# Patient Record
Sex: Male | Born: 1965 | Race: Black or African American | Hispanic: No | Marital: Married | Smoking: Never smoker
Health system: Southern US, Community
[De-identification: ages and names within clinical notes are randomized; demographics above are authoritative.]

## PROBLEM LIST (undated history)

## (undated) DIAGNOSIS — F419 Anxiety disorder, unspecified: Secondary | ICD-10-CM

## (undated) DIAGNOSIS — M1990 Unspecified osteoarthritis, unspecified site: Secondary | ICD-10-CM

## (undated) DIAGNOSIS — K219 Gastro-esophageal reflux disease without esophagitis: Secondary | ICD-10-CM

## (undated) HISTORY — DX: Unspecified osteoarthritis, unspecified site: M19.90

## (undated) HISTORY — DX: Anxiety disorder, unspecified: F41.9

## (undated) HISTORY — DX: Gastro-esophageal reflux disease without esophagitis: K21.9

---

## 1997-11-28 ENCOUNTER — Emergency Department (HOSPITAL_COMMUNITY): Admission: EM | Admit: 1997-11-28 | Discharge: 1997-11-28 | Payer: Self-pay | Admitting: Emergency Medicine

## 1997-12-08 ENCOUNTER — Emergency Department (HOSPITAL_COMMUNITY): Admission: EM | Admit: 1997-12-08 | Discharge: 1997-12-08 | Payer: Self-pay | Admitting: Emergency Medicine

## 1997-12-12 ENCOUNTER — Emergency Department (HOSPITAL_COMMUNITY): Admission: EM | Admit: 1997-12-12 | Discharge: 1997-12-12 | Payer: Self-pay | Admitting: Emergency Medicine

## 1998-02-27 ENCOUNTER — Emergency Department (HOSPITAL_COMMUNITY): Admission: EM | Admit: 1998-02-27 | Discharge: 1998-02-27 | Payer: Self-pay | Admitting: Emergency Medicine

## 1998-11-08 ENCOUNTER — Emergency Department (HOSPITAL_COMMUNITY): Admission: EM | Admit: 1998-11-08 | Discharge: 1998-11-08 | Payer: Self-pay | Admitting: Emergency Medicine

## 2000-05-03 ENCOUNTER — Ambulatory Visit (HOSPITAL_BASED_OUTPATIENT_CLINIC_OR_DEPARTMENT_OTHER): Admission: RE | Admit: 2000-05-03 | Discharge: 2000-05-03 | Payer: Self-pay | Admitting: *Deleted

## 2000-05-29 ENCOUNTER — Other Ambulatory Visit: Admission: RE | Admit: 2000-05-29 | Discharge: 2000-05-29 | Payer: Self-pay | Admitting: *Deleted

## 2000-05-29 ENCOUNTER — Encounter (INDEPENDENT_AMBULATORY_CARE_PROVIDER_SITE_OTHER): Payer: Self-pay

## 2002-06-01 ENCOUNTER — Emergency Department (HOSPITAL_COMMUNITY): Admission: EM | Admit: 2002-06-01 | Discharge: 2002-06-01 | Payer: Self-pay | Admitting: Emergency Medicine

## 2003-06-16 ENCOUNTER — Emergency Department (HOSPITAL_COMMUNITY): Admission: EM | Admit: 2003-06-16 | Discharge: 2003-06-16 | Payer: Self-pay | Admitting: Emergency Medicine

## 2003-07-12 ENCOUNTER — Emergency Department (HOSPITAL_COMMUNITY): Admission: EM | Admit: 2003-07-12 | Discharge: 2003-07-12 | Payer: Self-pay | Admitting: Emergency Medicine

## 2003-08-18 ENCOUNTER — Emergency Department (HOSPITAL_COMMUNITY): Admission: EM | Admit: 2003-08-18 | Discharge: 2003-08-18 | Payer: Self-pay

## 2005-08-20 ENCOUNTER — Emergency Department (HOSPITAL_COMMUNITY): Admission: EM | Admit: 2005-08-20 | Discharge: 2005-08-20 | Payer: Self-pay | Admitting: Emergency Medicine

## 2005-10-09 ENCOUNTER — Emergency Department (HOSPITAL_COMMUNITY): Admission: EM | Admit: 2005-10-09 | Discharge: 2005-10-09 | Payer: Self-pay | Admitting: Emergency Medicine

## 2005-10-11 ENCOUNTER — Emergency Department (HOSPITAL_COMMUNITY): Admission: EM | Admit: 2005-10-11 | Discharge: 2005-10-11 | Payer: Self-pay | Admitting: Emergency Medicine

## 2008-04-03 ENCOUNTER — Emergency Department (HOSPITAL_COMMUNITY): Admission: EM | Admit: 2008-04-03 | Discharge: 2008-04-03 | Payer: Self-pay | Admitting: Emergency Medicine

## 2008-12-23 ENCOUNTER — Emergency Department (HOSPITAL_COMMUNITY): Admission: EM | Admit: 2008-12-23 | Discharge: 2008-12-23 | Payer: Self-pay | Admitting: Family Medicine

## 2009-01-01 ENCOUNTER — Emergency Department (HOSPITAL_COMMUNITY): Admission: EM | Admit: 2009-01-01 | Discharge: 2009-01-01 | Payer: Self-pay | Admitting: Emergency Medicine

## 2009-06-29 ENCOUNTER — Emergency Department (HOSPITAL_COMMUNITY)
Admission: EM | Admit: 2009-06-29 | Discharge: 2009-06-30 | Payer: Self-pay | Source: Home / Self Care | Admitting: Emergency Medicine

## 2009-11-04 ENCOUNTER — Emergency Department (HOSPITAL_COMMUNITY): Admission: EM | Admit: 2009-11-04 | Discharge: 2009-11-04 | Payer: Self-pay | Admitting: Emergency Medicine

## 2010-07-11 LAB — BASIC METABOLIC PANEL
BUN: 11 mg/dL (ref 6–23)
Calcium: 9.7 mg/dL (ref 8.4–10.5)
Creatinine, Ser: 0.97 mg/dL (ref 0.4–1.5)
GFR calc Af Amer: >60 mL/min (ref >60)
GFR calc non Af Amer: >60 mL/min (ref >60)
Glucose, Bld: 91 mg/dL (ref 70–99)

## 2010-07-11 LAB — DIFFERENTIAL
Lymphocytes Relative: 29 % (ref 12–46)
Lymphs Abs: 2.5 10*3/uL (ref 0.7–4.0)
Monocytes Absolute: 1 10*3/uL (ref 0.1–1.0)
Monocytes Relative: 11 % (ref 3–12)
Neutro Abs: 4.9 10*3/uL (ref 1.7–7.7)
Neutrophils Relative %: 56 % (ref 43–77)

## 2010-07-11 LAB — CBC
MCV: 93.7 fL (ref 78.0–100.0)
Platelets: 256 10*3/uL (ref 150–400)
RDW: 12.5 % (ref 11.5–15.5)
WBC: 8.8 10*3/uL (ref 4.0–10.5)

## 2010-07-11 LAB — PROTIME-INR
INR: 1 (ref 0.00–1.49)
Prothrombin Time: 13.1 seconds (ref 11.6–15.2)

## 2010-07-11 LAB — POCT CARDIAC MARKERS
CKMB, poc: <1 ng/mL — ABNORMAL LOW (ref 1.0–8.0)
Myoglobin, poc: 99.4 ng/mL (ref 12–200)

## 2010-07-18 LAB — CBC
HCT: 37.6 % — ABNORMAL LOW (ref 39.0–52.0)
Hemoglobin: 13 g/dL (ref 13.0–17.0)
WBC: 12.3 10*3/uL — ABNORMAL HIGH (ref 4.0–10.5)

## 2010-07-18 LAB — POCT CARDIAC MARKERS

## 2010-07-18 LAB — POCT I-STAT, CHEM 8
BUN: 13 mg/dL (ref 6–23)
Calcium, Ion: 1.16 mmol/L (ref 1.12–1.32)
Chloride: 105 mEq/L (ref 96–112)
Glucose, Bld: 91 mg/dL (ref 70–99)
HCT: 41 % (ref 39.0–52.0)
Hemoglobin: 13.9 g/dL (ref 13.0–17.0)

## 2010-07-18 LAB — DIFFERENTIAL
Basophils Absolute: 0 10*3/uL (ref 0.0–0.1)
Eosinophils Relative: 0 % (ref 0–5)
Lymphocytes Relative: 14 % (ref 12–46)
Monocytes Absolute: 1 10*3/uL (ref 0.1–1.0)

## 2010-07-31 LAB — POCT I-STAT, CHEM 8
BUN: 11 mg/dL (ref 6–23)
Chloride: 105 mEq/L (ref 96–112)
Creatinine, Ser: 1 mg/dL (ref 0.4–1.5)
Hemoglobin: 15 g/dL (ref 13.0–17.0)
Potassium: 3.9 mEq/L (ref 3.5–5.1)
Sodium: 141 mEq/L (ref 135–145)

## 2010-08-12 ENCOUNTER — Ambulatory Visit (HOSPITAL_COMMUNITY)
Admission: RE | Admit: 2010-08-12 | Discharge: 2010-08-12 | Disposition: A | Discharge status: Home / Self Care | Payer: BC Managed Care – PPO | Source: Ambulatory Visit | Attending: Gastroenterology | Admitting: Gastroenterology

## 2010-08-12 DIAGNOSIS — R159 Full incontinence of feces: Secondary | ICD-10-CM | POA: Insufficient documentation

## 2010-08-12 DIAGNOSIS — R195 Other fecal abnormalities: Secondary | ICD-10-CM | POA: Insufficient documentation

## 2010-08-12 DIAGNOSIS — K645 Perianal venous thrombosis: Secondary | ICD-10-CM | POA: Insufficient documentation

## 2010-08-12 DIAGNOSIS — K6289 Other specified diseases of anus and rectum: Secondary | ICD-10-CM | POA: Insufficient documentation
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
## 2011-01-28 LAB — POCT CARDIAC MARKERS
CKMB, poc: 1.6 ng/mL (ref 1.0–8.0)
Myoglobin, poc: 118 ng/mL (ref 12–200)
Troponin i, poc: <0.05 ng/mL (ref 0.00–0.09)

## 2011-01-28 LAB — CBC
HCT: 40.5 % (ref 39.0–52.0)
MCV: 93 fL (ref 78.0–100.0)
RBC: 4.36 MIL/uL (ref 4.22–5.81)
WBC: 7 10*3/uL (ref 4.0–10.5)

## 2011-01-28 LAB — COMPREHENSIVE METABOLIC PANEL
ALT: 20 U/L (ref 0–53)
AST: 25 U/L (ref 0–37)
Alkaline Phosphatase: 51 U/L (ref 39–117)
CO2: 31 mEq/L (ref 19–32)
Calcium: 9.8 mg/dL (ref 8.4–10.5)
GFR calc Af Amer: >60 mL/min (ref >60)
GFR calc non Af Amer: >60 mL/min (ref >60)
Glucose, Bld: 98 mg/dL (ref 70–99)
Potassium: 4.3 mEq/L (ref 3.5–5.1)
Sodium: 144 mEq/L (ref 135–145)

## 2011-01-28 LAB — DIFFERENTIAL
Basophils Absolute: 0 10*3/uL (ref 0.0–0.1)
Eosinophils Absolute: 0.2 10*3/uL (ref 0.0–0.7)
Eosinophils Relative: 3 % (ref 0–5)
Lymphs Abs: 1.6 10*3/uL (ref 0.7–4.0)
Neutrophils Relative %: 62 % (ref 43–77)

## 2011-07-06 ENCOUNTER — Telehealth: Payer: Self-pay

## 2011-07-06 NOTE — Telephone Encounter (Signed)
Pt is calling to request a GI referral and if he needs to come in for another physical he will please call

## 2011-07-07 ENCOUNTER — Emergency Department (HOSPITAL_COMMUNITY)
Admission: EM | Admit: 2011-07-07 | Discharge: 2011-07-07 | Disposition: A | Discharge status: Home / Self Care | Payer: BC Managed Care – PPO | Attending: Emergency Medicine | Admitting: Emergency Medicine

## 2011-07-07 ENCOUNTER — Encounter (HOSPITAL_COMMUNITY): Payer: Self-pay | Admitting: Nurse Practitioner

## 2011-07-07 ENCOUNTER — Emergency Department (HOSPITAL_COMMUNITY): Payer: BC Managed Care – PPO

## 2011-07-07 DIAGNOSIS — K2100 Gastro-esophageal reflux disease with esophagitis, without bleeding: Secondary | ICD-10-CM | POA: Insufficient documentation

## 2011-07-07 DIAGNOSIS — R110 Nausea: Secondary | ICD-10-CM | POA: Insufficient documentation

## 2011-07-07 MED ORDER — PANTOPRAZOLE SODIUM 20 MG PO TBEC: 20.0000 mg | DELAYED_RELEASE_TABLET | Freq: Every day | ORAL | Status: AC

## 2011-07-07 MED ORDER — GI COCKTAIL ~~LOC~~
30.0000 mL | Freq: Once | ORAL | Status: AC
  Administered 2011-07-07: 30 mL via ORAL
  Filled 2011-07-07: qty 30

## 2011-07-07 NOTE — Discharge Instructions (Signed)
Continue zantac. Continue to follow good diet, avoid spicy foods, caffeine, tomato products. Start taking protonix as prescribed. Follow up with primary care doctor or gastroenterologist as referred.   Gastroesophageal Reflux Disease, Adult Gastroesophageal reflux disease (GERD) happens when acid from your stomach flows up into the esophagus. When acid comes in contact with the esophagus, the acid causes soreness (inflammation) in the esophagus. Over time, GERD may create small holes (ulcers) in the lining of the esophagus. CAUSES   Increased body weight. This puts pressure on the stomach, making acid rise from the stomach into the esophagus.   Smoking. This increases acid production in the stomach.   Drinking alcohol. This causes decreased pressure in the lower esophageal sphincter (valve or ring of muscle between the esophagus and stomach), allowing acid from the stomach into the esophagus.   Late evening meals and a full stomach. This increases pressure and acid production in the stomach.   A malformed lower esophageal sphincter.  Sometimes, no cause is found. SYMPTOMS   Burning pain in the lower part of the mid-chest behind the breastbone and in the mid-stomach area. This may occur twice a week or more often.   Trouble swallowing.   Sore throat.   Dry cough.   Asthma-like symptoms including chest tightness, shortness of breath, or wheezing.  DIAGNOSIS  Your caregiver may be able to diagnose GERD based on your symptoms. In some cases, X-rays and other tests may be done to check for complications or to check the condition of your stomach and esophagus. TREATMENT  Your caregiver may recommend over-the-counter or prescription medicines to help decrease acid production. Ask your caregiver before starting or adding any new medicines.  HOME CARE INSTRUCTIONS   Change the factors that you can control. Ask your caregiver for guidance concerning weight loss, quitting smoking, and alcohol  consumption.   Avoid foods and drinks that make your symptoms worse, such as:   Caffeine or alcoholic drinks.   Chocolate.   Peppermint or mint flavorings.   Garlic and onions.   Spicy foods.   Citrus fruits, such as oranges, lemons, or limes.   Tomato-based foods such as sauce, chili, salsa, and pizza.   Fried and fatty foods.   Avoid lying down for the 3 hours prior to your bedtime or prior to taking a nap.   Eat small, frequent meals instead of large meals.   Wear loose-fitting clothing. Do not wear anything tight around your waist that causes pressure on your stomach.   Raise the head of your bed 6 to 8 inches with wood blocks to help you sleep. Extra pillows will not help.   Only take over-the-counter or prescription medicines for pain, discomfort, or fever as directed by your caregiver.   Do not take aspirin, ibuprofen, or other nonsteroidal anti-inflammatory drugs (NSAIDs).  SEEK IMMEDIATE MEDICAL CARE IF:   You have pain in your arms, neck, jaw, teeth, or back.   Your pain increases or changes in intensity or duration.   You develop nausea, vomiting, or sweating (diaphoresis).   You develop shortness of breath, or you faint.   Your vomit is green, yellow, black, or looks like coffee grounds or blood.   Your stool is red, bloody, or black.  These symptoms could be signs of other problems, such as heart disease, gastric bleeding, or esophageal bleeding. MAKE SURE YOU:   Understand these instructions.   Will watch your condition.   Will get help right away if you are not doing  well or get worse.  Document Released: 01/19/2005 Document Revised: 03/31/2011 Document Reviewed: 10/29/2010 [facility] Patient Information 2012 [place], [place].

## 2011-07-07 NOTE — ED Provider Notes (Signed)
Medical screening examination/treatment/procedure(s) were performed by non-physician practitioner and as supervising physician I was immediately available for consultation/collaboration.   Dione Booze, MD 07/07/11 [phone]

## 2011-07-07 NOTE — ED Notes (Signed)
Pt reports GERD for past years, getting worse, wants to see a GI doctor but was told he needs a referral. States he can not even sleep at night d/t acid and burning. Describes as "icy hot sensation" in upper back.

## 2011-07-07 NOTE — ED Provider Notes (Signed)
History     CSN: [id number]  Arrival date & time 07/07/11  4098   First MD Initiated Contact with Patient 07/07/11 1027      Chief Complaint  Patient presents with  . Nausea  . Gastrophageal Reflux    (Consider location/radiation/quality/duration/timing/severity/associated sxs/prior treatment) Patient is a 46 y.o. male presenting with GERD. The history is provided by the patient.  Gastrophageal Reflux This is a chronic problem. The current episode started more than 1 year ago. The problem occurs intermittently. The problem has been unchanged. Associated symptoms include nausea. Pertinent negatives include no abdominal pain, chest pain, chills, coughing, fever, sore throat or vomiting. The symptoms are aggravated by eating (laying down).  Pt states he has had problems with reflux for years. States he is now on zantac, he changed his diet, he eats hours before laying down. States recently in the last 1-2 weeks, symptoms has worsened and current medications are not helping. States symptoms most of the time only at night. Denies cough, shortness of breath, vomiting, abdominal pain. States feels burning and cool sensation to the upper back when symptoms are there. Admits to nausea as well.   History reviewed. No pertinent past medical history.  History reviewed. No pertinent past surgical history.  History reviewed. No pertinent family history.  History  Substance Use Topics  . Smoking status: Never Smoker   . Smokeless tobacco: Not on file  . Alcohol Use: 1.8 oz/week    3 Glasses of wine per week     social      Review of Systems  Constitutional: Negative for fever and chills.  HENT: Negative.  Negative for sore throat.   Eyes: Negative.   Respiratory: Negative for cough, chest tightness and shortness of breath.   Cardiovascular: Negative for chest pain and palpitations.  Gastrointestinal: Positive for nausea. Negative for vomiting and abdominal pain.  Genitourinary:  Negative.   Musculoskeletal: Negative.   Skin: Negative.   Neurological: Negative.   Psychiatric/Behavioral: Negative.     Allergies  Review of patient's allergies indicates no known allergies.  Home Medications   Current Outpatient Rx  Name Route Sig Dispense Refill  . ADULT MULTIVITAMIN W/MINERALS CH Oral Take 1 tablet by mouth daily.    . OMEGA-3-ACID ETHYL ESTERS 1 G PO CAPS Oral Take 2 g by mouth daily.    Marland Kitchen OMEPRAZOLE 20 MG PO CPDR Oral Take 20 mg by mouth daily.      BP 124/70  Pulse 74  Temp(Src) 97.8 F (36.6 C) (Oral)  Resp 16  Ht 5\' 10"  (1.778 m)  Wt 200 lb (90.719 kg)  BMI 28.70 kg/m2  SpO2 99%  Physical Exam  Nursing note and vitals reviewed. Constitutional: He is oriented to person, place, and time. He appears well-developed and well-nourished. No distress.  HENT:  Head: Normocephalic.  Eyes: Conjunctivae are normal.  Neck: Neck supple.  Cardiovascular: Normal rate, regular rhythm and normal heart sounds.   Pulmonary/Chest: Effort normal and breath sounds normal. No respiratory distress. He has no wheezes. He has no rales.  Abdominal: Soft. Bowel sounds are normal. He exhibits no distension. There is no tenderness.  Musculoskeletal: Normal range of motion. He exhibits no edema.  Neurological: He is alert and oriented to person, place, and time.  Skin: Skin is warm and dry.  Psychiatric: He has a normal mood and affect.    ED Course  Procedures (including critical care time)  CXR obtained due to persistent back pain, negative. Suspect this may  be related to pt's reflux. Symptoms resolved with GI coctail. Will add protonix. Follow up with GI. Doubt cardiac, not excertional, no SOB, for several months now.  Dg Chest 2 View  07/07/2011  *RADIOLOGY REPORT*  Clinical Data: Reflux, mid back discomfort  CHEST - 2 VIEW  Comparison:  November 04, 2009  Findings: The cardiac silhouette, mediastinum, pulmonary vasculature are within normal limits.  Both lungs are  clear. There is no acute bony abnormality.  IMPRESSION: There is no evidence of acute cardiac or pulmonary process.  Original Report Authenticated By: Brandon Melnick, M.D.    Will add PPI. Pt is symptom free. VS all within normal. Will d/c home.   No diagnosis found.    MDM          Lottie Mussel, PA 07/07/11 1213

## 2011-07-08 NOTE — Telephone Encounter (Signed)
TC pt. No answer and VM full, could not LM on H #, and HIPPA does not give permission to call at work. Pt will need to be seen bf we could refer him since it has been a yr since we have seen him, and not for GI issue. Last CPE was 02/26/10 if he wants to go ahead and get a CPE.

## 2011-07-09 NOTE — Telephone Encounter (Signed)
Called patient, VM full.

## 2011-07-10 NOTE — Telephone Encounter (Signed)
LMOM notifying patient to RTC/make appt for CPE, and discussion of GI referral.

## 2011-07-20 ENCOUNTER — Telehealth: Payer: Self-pay | Admitting: Internal Medicine

## 2011-07-20 NOTE — Telephone Encounter (Signed)
Pt came into office requesting to be seen, had records with him. Pt had colon with Dr. Reece Agar at [facility] 08/12/10. Left message for pt to call back.

## 2011-07-21 NOTE — Telephone Encounter (Signed)
I will not accept this patient. This patient needs to return to his GI doctor, Dr Laural Benes.

## 2011-07-21 NOTE — Telephone Encounter (Signed)
Spoke with pt and explained the office policy that the doc of the day would have to review his records and decide if we would take him as a pt. Pt was seen almost a year ago by Dr. Reece Agar and had a colonoscopy by Dr. Laural Benes. Pt was seen in the ER 07/07/11 for pain, was told to follow-up with GI due to possible reflux. Pt is requesting to be seen here. Records placed on Dr. Lamar Sprinkles desk for review as he was doc of the day when the pt came in yesterday.

## 2011-07-21 NOTE — Telephone Encounter (Signed)
Message left for pt that Dr. Marina Goodell will not see this pt and that he needs to return to the care of Dr. Laural Benes.

## 2011-10-21 ENCOUNTER — Ambulatory Visit (INDEPENDENT_AMBULATORY_CARE_PROVIDER_SITE_OTHER): Payer: BC Managed Care – PPO | Admitting: Internal Medicine

## 2011-10-21 VITALS — BP 117/70 | HR 76 | Temp 98.3°F | Resp 16 | Ht 70.5 in | Wt 208.0 lb

## 2011-10-21 DIAGNOSIS — H109 Unspecified conjunctivitis: Secondary | ICD-10-CM

## 2011-10-21 DIAGNOSIS — M25529 Pain in unspecified elbow: Secondary | ICD-10-CM

## 2011-10-21 MED ORDER — MELOXICAM 15 MG PO TABS: 15.0000 mg | ORAL_TABLET | Freq: Every day | ORAL | Status: AC

## 2011-10-21 MED ORDER — ERYTHROMYCIN 5 MG/GM OP OINT: TOPICAL_OINTMENT | OPHTHALMIC | Status: DC

## 2011-10-22 ENCOUNTER — Encounter: Payer: Self-pay | Admitting: Internal Medicine

## 2011-10-22 NOTE — Progress Notes (Signed)
  Subjective:    Patient ID: Joel [name], male    DOB: November 16, 1965, 46 y.o.   MRN: [id number]  HPI2 problems 24 hours of irritation with discharge in the right eye/no change in vision/no injury/no fever/no URI  10 days of pain in the right elbow with use/worse with grip No known injury and no clear occupational or recreational cause other than recent power lifting    Review of SystemsNo illnesses and no medication     Objective:   Physical Exam  Eyes:         Scabbed lesions on the lower lid without cellulitis Conjunctiva actually clear Slightly purulent dischargeinnercanthus  Vital signs stable Rest of ENT clear Except tender right preauricular node 0.5 cm  The right elbow is tender over the lateral epicondyle/there's pain with grip/and pain with full supination No swelling or redness      Assessment & Plan:  Problem #1 adenovirus versus bacterial conjunctivitis Erythromycin ointment 3 times a day/close followup  Problem #2 lateral epicondylitis on the right Mobic 15 daily for one month Rehabilitation exercises given/recheck 1 month if not well

## 2011-10-22 NOTE — Progress Notes (Deleted)
  Subjective:    Patient ID: Joel [name], male    DOB: 1965/09/01, 46 y.o.   MRN: [id number]  HPI    Review of Systems     Objective:   Physical Exam        Assessment & Plan:

## 2012-01-26 ENCOUNTER — Encounter: Payer: Self-pay | Admitting: Family Medicine

## 2012-01-26 ENCOUNTER — Ambulatory Visit (INDEPENDENT_AMBULATORY_CARE_PROVIDER_SITE_OTHER): Payer: BC Managed Care – PPO | Admitting: Family Medicine

## 2012-01-26 VITALS — BP 118/72 | HR 64 | Temp 97.2°F | Resp 16 | Ht 70.0 in | Wt 195.0 lb

## 2012-01-26 DIAGNOSIS — R6882 Decreased libido: Secondary | ICD-10-CM

## 2012-01-26 DIAGNOSIS — K219 Gastro-esophageal reflux disease without esophagitis: Secondary | ICD-10-CM | POA: Insufficient documentation

## 2012-01-26 DIAGNOSIS — Z23 Encounter for immunization: Secondary | ICD-10-CM

## 2012-01-26 DIAGNOSIS — H6120 Impacted cerumen, unspecified ear: Secondary | ICD-10-CM

## 2012-01-26 DIAGNOSIS — Z0000 Encounter for general adult medical examination without abnormal findings: Secondary | ICD-10-CM

## 2012-01-26 LAB — COMPREHENSIVE METABOLIC PANEL
ALT: 11 U/L (ref 0–53)
AST: 15 U/L (ref 0–37)
Albumin: 4.7 g/dL (ref 3.5–5.2)
Alkaline Phosphatase: 47 U/L (ref 39–117)
BUN: 17 mg/dL (ref 6–23)
CO2: 26 mEq/L (ref 19–32)
Calcium: 10 mg/dL (ref 8.4–10.5)
Chloride: 103 mEq/L (ref 96–112)
Creat: 1.09 mg/dL (ref 0.50–1.35)
Glucose, Bld: 78 mg/dL (ref 70–99)
Potassium: 3.9 mEq/L (ref 3.5–5.3)
Sodium: 139 mEq/L (ref 135–145)
Total Bilirubin: 0.8 mg/dL (ref 0.3–1.2)
Total Protein: 7.6 g/dL (ref 6.0–8.3)

## 2012-01-26 LAB — LIPID PANEL
Cholesterol: 154 mg/dL (ref 0–200)
HDL: 35 mg/dL — ABNORMAL LOW (ref >39)
LDL Cholesterol: 97 mg/dL (ref 0–99)
Total CHOL/HDL Ratio: 4.4 Ratio
Triglycerides: 111 mg/dL (ref <150)
VLDL: 22 mg/dL (ref 0–40)

## 2012-01-26 LAB — POCT UA - MICROSCOPIC ONLY
Bacteria, U Microscopic: NEGATIVE
Casts, Ur, LPF, POC: NEGATIVE
Crystals, Ur, HPF, POC: NEGATIVE
RBC, urine, microscopic: NEGATIVE
Yeast, UA: NEGATIVE

## 2012-01-26 LAB — POCT URINALYSIS DIPSTICK
Bilirubin, UA: NEGATIVE
Blood, UA: NEGATIVE
Glucose, UA: NEGATIVE
Ketones, UA: NEGATIVE
Leukocytes, UA: NEGATIVE
Nitrite, UA: NEGATIVE
Protein, UA: NEGATIVE
Spec Grav, UA: 1.015
Urobilinogen, UA: 0.2
pH, UA: 7

## 2012-01-26 LAB — IFOBT (OCCULT BLOOD): IFOBT: NEGATIVE

## 2012-01-26 LAB — TESTOSTERONE: Testosterone: 249 ng/dL — ABNORMAL LOW (ref 300–890)

## 2012-01-26 NOTE — Progress Notes (Signed)
@UMFCLOGO @  Patient ID: Joel [name] MRN: [id number], DOB: [birth year] 46 y.o. Date of Encounter: 01/26/2012, 8:55 AM  Primary Physician: Sheila Oats, MD  Chief Complaint: Physical (CPE)  HPI: 46 y.o. y/o male with history noted below here for CPE.  Doing well.  Notes some loss of libido No issues/complaints. Daily walks GERD controlled by taking OTC omeprazole 2 weeks each month Works in Substance Abuse counseling at [employer]  Review of Systems: Consitutional: No fever, chills, fatigue, night sweats, lymphadenopathy, or weight changes. Eyes: No visual changes, eye redness, or discharge.  Patient having "fluid buildup in eyes, followed by doctor in [facility]" ENT/Mouth: Ears: No otalgia, tinnitus, hearing loss, discharge. Nose: No congestion, rhinorrhea, sinus pain, or epistaxis. Throat: No sore throat, post nasal drip, or teeth pain. Cardiovascular: No CP, palpitations, diaphoresis, DOE, edema, orthopnea, PND. Respiratory: No cough, hemoptysis, SOB, or wheezing. Gastrointestinal: No anorexia, dysphagia, reflux, pain, nausea, vomiting, hematemesis, diarrhea, constipation, BRBPR, or melena. Genitourinary: No dysuria, frequency, urgency, hematuria, incontinence, nocturia, decreased urinary stream, discharge, impotence, or testicular pain/masses. Musculoskeletal: No decreased ROM, myalgias, stiffness, joint swelling, or weakness. Skin: No rash, erythema, lesion changes, pain, warmth, jaundice, or pruritis. Neurological: No headache, dizziness, syncope, seizures, tremors, memory loss, coordination problems, or paresthesias. Psychological: No anxiety, depression, hallucinations, SI/HI. Endocrine: No fatigue, polydipsia, polyphagia, polyuria, or known diabetes. All other systems were reviewed and are otherwise negative.  Past Medical History  Diagnosis Date  . GERD (gastroesophageal reflux disease)      No past surgical history on file.  Home Meds:  Prior to Admission medications    Medication Sig Start Date End Date Taking? Authorizing Provider  Multiple Vitamin (MULITIVITAMIN WITH MINERALS) TABS Take 1 tablet by mouth daily.   Yes Historical Provider, MD  erythromycin ophthalmic ointment Apply a 1 cm ribbon 3 times a day 10/21/11   Tonye Pearson, MD  meloxicam (MOBIC) 15 MG tablet Take 1 tablet (15 mg total) by mouth daily. 10/21/11 10/20/12  Tonye Pearson, MD  omega-3 acid ethyl esters (LOVAZA) 1 G capsule Take 2 g by mouth daily.    Historical Provider, MD  omeprazole (PRILOSEC) 20 MG capsule Take 20 mg by mouth daily.    Historical Provider, MD  pantoprazole (PROTONIX) 20 MG tablet Take 1 tablet (20 mg total) by mouth daily. 07/07/11 07/06/12  Tatyana A Kirichenko, PA    Allergies: No Known Allergies  History   Social History  . Marital Status: Married    Spouse Name: N/A    Number of Children: N/A  . Years of Education: N/A   Occupational History  . Not on file.   Social History Main Topics  . Smoking status: Never Smoker   . Smokeless tobacco: Not on file  . Alcohol Use: 1.8 oz/week    3 Glasses of wine per week     social  . Drug Use: No  . Sexually Active:    Other Topics Concern  . Not on file   Social History Narrative  . No narrative on file    Family History  Problem Relation Age of Onset  . Cerebral aneurysm Mother   . Alzheimer's disease Maternal Grandmother     Physical Exam: Blood pressure 118/72, pulse 64, temperature 97.2 F (36.2 C), temperature source Oral, resp. rate 16, height 5\' 10"  (1.778 m), weight 195 lb (88.451 kg).  General: Well developed, well nourished, in no acute distress. HEENT: Normocephalic, atraumatic. Conjunctiva pink, sclera non-icteric. Pupils 2 mm constricting to 1 mm,  round, regular, and equally reactive to light and accomodation. EOMI. Internal auditory canal clear.Cerumen impaction bilat TMs with good cone of light and without pathology. Nasal mucosa pink. Nares are without discharge. No sinus  tenderness. Oral mucosa pink. Dentition fair. Pharynx without exudate.   Neck: Supple. Trachea midline. No thyromegaly. Full ROM. No lymphadenopathy. Lungs: Clear to auscultation bilaterally without wheezes, rales, or rhonchi. Breathing is of normal effort and unlabored. Cardiovascular: RRR with S1 S2. No murmurs, rubs, or gallops appreciated. Distal pulses 2+ symmetrically. No carotid or abdominal bruits Abdomen: Soft, non-tender, non-distended with normoactive bowel sounds. No hepatosplenomegaly or masses. No rebound/guarding. No CVA tenderness. Without hernias.  Rectal: No external hemorrhoids or fissures. Rectal vault without masses.  Genitourinary: uncircumcised male. No penile lesions. Testes descended bilaterally, and smooth without tenderness or masses.  Musculoskeletal: Full range of motion and 5/5 strength throughout. Without swelling, atrophy, tenderness, crepitus, or warmth. Extremities without clubbing, cyanosis, or edema. Calves supple. Skin: Warm and moist without erythema, ecchymosis, wounds, or rash. Neuro: A+Ox3. CN II-XII grossly intact. Moves all extremities spontaneously. Full sensation throughout. Normal gait. DTR 2+ throughout upper and lower extremities. Finger to nose intact. Psych:  Responds to questions appropriately with a normal affect.   Studies: CBC, CMET, Lipid, UA:   Assessment/Plan:  46 y.o. y/o  male here for CPE - 1. Routine general medical examination at a health care facility  POCT UA - Microscopic Only, POCT urinalysis dipstick, CBC with Differential, Comprehensive metabolic panel, Lipid panel, Testosterone, IFOBT POC (occult bld, rslt in office), Td vaccine greater than or equal to 7yo preservative free IM  2. Loss of libido      I lavaged both ears clear of all wax.  TM's and canals now normal Signed, Elvina Sidle, MD 01/26/2012 8:55 AM   Results for orders placed in visit on 01/26/12  POCT UA - MICROSCOPIC ONLY      Component Value Range    WBC, Ur, HPF, POC 0-1     RBC, urine, microscopic neg     Bacteria, U Microscopic neg     Mucus, UA trace     Epithelial cells, urine per micros 0-1     Crystals, Ur, HPF, POC neg     Casts, Ur, LPF, POC neg     Yeast, UA neg    POCT URINALYSIS DIPSTICK      Component Value Range   Color, UA yellow     Clarity, UA clear     Glucose, UA neg     Bilirubin, UA neg     Ketones, UA neg     Spec Grav, UA 1.015     Blood, UA neg     pH, UA 7.0     Protein, UA neg     Urobilinogen, UA 0.2     Nitrite, UA neg     Leukocytes, UA Negative    IFOBT (OCCULT BLOOD)      Component Value Range   IFOBT Negative

## 2012-01-27 ENCOUNTER — Other Ambulatory Visit: Payer: Self-pay | Admitting: Family Medicine

## 2012-01-27 DIAGNOSIS — E291 Testicular hypofunction: Secondary | ICD-10-CM

## 2012-01-27 LAB — CBC WITH DIFFERENTIAL/PLATELET
Basophils Absolute: 0.1 10*3/uL (ref 0.0–0.1)
Basophils Relative: 1 % (ref 0–1)
Eosinophils Absolute: 0.3 10*3/uL (ref 0.0–0.7)
Eosinophils Relative: 3 % (ref 0–5)
HCT: 40 % (ref 39.0–52.0)
Hemoglobin: 13.4 g/dL (ref 13.0–17.0)
Lymphocytes Relative: 22 % (ref 12–46)
Lymphs Abs: 1.8 10*3/uL (ref 0.7–4.0)
MCH: 30.3 pg (ref 26.0–34.0)
MCHC: 33.5 g/dL (ref 30.0–36.0)
MCV: 90.5 fL (ref 78.0–100.0)
Monocytes Absolute: 0.9 10*3/uL (ref 0.1–1.0)
Monocytes Relative: 11 % (ref 3–12)
Neutro Abs: 5.1 10*3/uL (ref 1.7–7.7)
Neutrophils Relative %: 63 % (ref 43–77)
Platelets: 375 10*3/uL (ref 150–400)
RBC: 4.42 MIL/uL (ref 4.22–5.81)
RDW: 12.8 % (ref 11.5–15.5)
WBC: 8.1 10*3/uL (ref 4.0–10.5)

## 2012-02-02 ENCOUNTER — Telehealth: Payer: Self-pay

## 2012-02-02 NOTE — Telephone Encounter (Signed)
mailed

## 2012-02-02 NOTE — Telephone Encounter (Signed)
PT WOULD LIKE TO HAVE HIS LAB WORK MAILED TO HIS HOME, WE HAD CALLED AND GIVEN HIM THE RESULTS, BUT WOULD LIKE TO HAVE A COPY FOR HIMSELF PLEASE CALL [phone] IF NEEDED

## 2012-05-01 ENCOUNTER — Ambulatory Visit (INDEPENDENT_AMBULATORY_CARE_PROVIDER_SITE_OTHER): Payer: BC Managed Care – PPO | Admitting: Family Medicine

## 2012-05-01 VITALS — BP 128/78 | HR 104 | Temp 98.0°F | Resp 17 | Ht 70.5 in | Wt 201.0 lb

## 2012-05-01 DIAGNOSIS — N489 Disorder of penis, unspecified: Secondary | ICD-10-CM

## 2012-05-01 DIAGNOSIS — R8281 Pyuria: Secondary | ICD-10-CM

## 2012-05-01 DIAGNOSIS — R319 Hematuria, unspecified: Secondary | ICD-10-CM

## 2012-05-01 DIAGNOSIS — Z7251 High risk heterosexual behavior: Secondary | ICD-10-CM

## 2012-05-01 DIAGNOSIS — N453 Epididymo-orchitis: Secondary | ICD-10-CM

## 2012-05-01 DIAGNOSIS — R82998 Other abnormal findings in urine: Secondary | ICD-10-CM

## 2012-05-01 DIAGNOSIS — R300 Dysuria: Secondary | ICD-10-CM

## 2012-05-01 DIAGNOSIS — Z7289 Other problems related to lifestyle: Secondary | ICD-10-CM

## 2012-05-01 DIAGNOSIS — N4889 Other specified disorders of penis: Secondary | ICD-10-CM

## 2012-05-01 DIAGNOSIS — N451 Epididymitis: Secondary | ICD-10-CM

## 2012-05-01 LAB — POCT UA - MICROSCOPIC ONLY: Crystals, Ur, HPF, POC: NEGATIVE

## 2012-05-01 MED ORDER — LEVOFLOXACIN 500 MG PO TABS: 500.0000 mg | ORAL_TABLET | Freq: Every day | ORAL | Status: DC

## 2012-05-01 NOTE — Patient Instructions (Addendum)
Drink plenty of fluids for possible urinary infection. Your should receive a call or letter about your lab results within the next week to 10 days.  Return to the clinic or go to the nearest emergency room if any of your symptoms worsen or new symptoms occur. Recheck if not improving in the next 1 week.    Epididymitis Epididymitis is a swelling (inflammation) of the epididymis. The epididymis is a cord-like structure along the back part of the testicle. Epididymitis is usually, but not always, caused by infection. This is usually a sudden problem beginning with chills, fever and pain behind the scrotum and in the testicle. There may be swelling and redness of the testicle. DIAGNOSIS  Physical examination will reveal a tender, swollen epididymis. Sometimes, cultures are obtained from the urine or from prostate secretions to help find out if there is an infection or if the cause is a different problem. Sometimes, blood work is performed to see if your white blood cell count is elevated and if a germ (bacterial) or viral infection is present. Using this knowledge, an appropriate medicine which kills germs (antibiotic) can be chosen by your caregiver. A viral infection causing epididymitis will most often go away (resolve) without treatment. HOME CARE INSTRUCTIONS   Hot sitz baths for 20 minutes, 4 times per day, may help relieve pain.  Only take over-the-counter or prescription medicines for pain, discomfort or fever as directed by your caregiver.  Take all medicines, including antibiotics, as directed. Take the antibiotics for the full prescribed length of time even if you are feeling better.  It is very important to keep all follow-up appointments. SEEK IMMEDIATE MEDICAL CARE IF:   You have a fever.  You have pain not relieved with medicines.  You have any worsening of your problems.  Your pain seems to come and go.  You develop pain, redness, and swelling in the scrotum and surrounding  areas. MAKE SURE YOU:   Understand these instructions.  Will watch your condition.  Will get help right away if you are not doing well or get worse. Document Released: 04/08/2000 Document Revised: 07/04/2011 Document Reviewed: 02/26/2009 [facility] Patient Information 2013 [place], [place].

## 2012-05-01 NOTE — Progress Notes (Signed)
Subjective:    Patient ID: Joel [name], male    DOB: May 11, 1965, 47 y.o.   MRN: [id number]  HPI Joel [name] is a 47 y.o. male  Penile and urethral pain - burning with urination for past week.  Small amt of clear discharge one time.  No prior similar symptoms. No rash.   New sexual contact - wife, but had been separated for few months, then back together few weeks ago - no condom.   No fever, otherwise feels well.  No recent STI testing, but had NGU 25 years ago in college. Hx of epididymitis, but current sx's not painful into testicles.  Review of Systems  Constitutional: Negative for fever and chills.  Gastrointestinal: Negative for nausea, vomiting and abdominal pain.  Genitourinary: Positive for dysuria and frequency. Negative for urgency, hematuria, scrotal swelling and testicular pain.      Objective:   Physical Exam  Vitals reviewed. Constitutional: He is oriented to person, place, and time. He appears well-developed and well-nourished. No distress.  Cardiovascular: Normal rate, regular rhythm, normal heart sounds and intact distal pulses.   Pulmonary/Chest: Effort normal and breath sounds normal.  Abdominal: Soft. He exhibits no distension. There is no tenderness. There is no rebound.  Genitourinary: Right testis shows no swelling and no tenderness. Left testis shows tenderness (L epididymis). Left testis shows no swelling. Circumcised. Penile tenderness (penile shaft. ) present. No discharge found.  Lymphadenopathy:       Left: Inguinal (no apparent enlargement, but ttp over palpable node. ) adenopathy present.  Neurological: He is alert and oriented to person, place, and time.  Skin: Skin is warm and dry. No rash noted.  Psychiatric: He has a normal mood and affect. His behavior is normal. Judgment and thought content normal.   Results for orders placed in visit on 05/01/12  POCT UA - MICROSCOPIC ONLY      Component Value Range   WBC, Ur, HPF, POC 1-2     RBC,  urine, microscopic 3-6     Bacteria, U Microscopic trace     Mucus, UA pos     Epithelial cells, urine per micros neg     Crystals, Ur, HPF, POC neg     Casts, Ur, LPF, POC neg     Yeast, UA neg         Assessment & Plan:  Joel [name] is a 47 y.o. male 1. Problems related to high-risk sexual behavior  HIV antibody, RPR, Hepatitis B surface antibody, Hepatitis B surface antigen, Hepatitis C antibody, HSV(herpes simplex vrs) 1+2 ab-IgG, GC/chlamydia probe amp, urine, GC/chlamydia probe amp, urine  2. Penile pain  HIV antibody, RPR, Hepatitis B surface antibody, Hepatitis B surface antigen, Hepatitis C antibody, HSV(herpes simplex vrs) 1+2 ab-IgG, GC/chlamydia probe amp, urine, GC/chlamydia probe amp, urine  3. Dysuria  HIV antibody, RPR, Hepatitis B surface antibody, Hepatitis B surface antigen, Hepatitis C antibody, HSV(herpes simplex vrs) 1+2 ab-IgG, GC/chlamydia probe amp, urine, POCT UA - Microscopic Only, levofloxacin (LEVAQUIN) 500 MG tablet, GC/chlamydia probe amp, urine  4. Epididymitis  HIV antibody, RPR, Hepatitis B surface antibody, Hepatitis B surface antigen, Hepatitis C antibody, HSV(herpes simplex vrs) 1+2 ab-IgG, GC/chlamydia probe amp, urine, POCT UA - Microscopic Only, levofloxacin (LEVAQUIN) 500 MG tablet, GC/chlamydia probe amp, urine    Suspected epididymitis, with dysuria, recently back with prior partner - will start levaquin for epididymitis over age 34, but check std tests above. (pt ahad to rtc for uriprobe greater than 3 hours).  Push fluids, abstinence or strict condom use until cx results. Urine culture for trace pyuria, hematuria.   Patient Instructions  Drink plenty of fluids for possible urinary infection. Your should receive a call or letter about your lab results within the next week to 10 days.  Return to the clinic or go to the nearest emergency room if any of your symptoms worsen or new symptoms occur. Recheck if not improving in the next 1 week.     Epididymitis Epididymitis is a swelling (inflammation) of the epididymis. The epididymis is a cord-like structure along the back part of the testicle. Epididymitis is usually, but not always, caused by infection. This is usually a sudden problem beginning with chills, fever and pain behind the scrotum and in the testicle. There may be swelling and redness of the testicle. DIAGNOSIS  Physical examination will reveal a tender, swollen epididymis. Sometimes, cultures are obtained from the urine or from prostate secretions to help find out if there is an infection or if the cause is a different problem. Sometimes, blood work is performed to see if your white blood cell count is elevated and if a germ (bacterial) or viral infection is present. Using this knowledge, an appropriate medicine which kills germs (antibiotic) can be chosen by your caregiver. A viral infection causing epididymitis will most often go away (resolve) without treatment. HOME CARE INSTRUCTIONS   Hot sitz baths for 20 minutes, 4 times per day, may help relieve pain.  Only take over-the-counter or prescription medicines for pain, discomfort or fever as directed by your caregiver.  Take all medicines, including antibiotics, as directed. Take the antibiotics for the full prescribed length of time even if you are feeling better.  It is very important to keep all follow-up appointments. SEEK IMMEDIATE MEDICAL CARE IF:   You have a fever.  You have pain not relieved with medicines.  You have any worsening of your problems.  Your pain seems to come and go.  You develop pain, redness, and swelling in the scrotum and surrounding areas. MAKE SURE YOU:   Understand these instructions.  Will watch your condition.  Will get help right away if you are not doing well or get worse. Document Released: 04/08/2000 Document Revised: 07/04/2011 Document Reviewed: 02/26/2009 [facility] Patient Information 2013 [place],  [place].

## 2012-05-02 ENCOUNTER — Ambulatory Visit: Payer: BC Managed Care – PPO | Admitting: Physician Assistant

## 2012-05-02 LAB — HEPATITIS C ANTIBODY: HCV Ab: NEGATIVE

## 2012-05-02 LAB — HSV(HERPES SIMPLEX VRS) I + II AB-IGG: HSV 1 Glycoprotein G Ab, IgG: 14.52 IV — ABNORMAL HIGH

## 2012-05-03 LAB — GC/CHLAMYDIA PROBE AMP, URINE
Chlamydia, Swab/Urine, PCR: NEGATIVE
GC Probe Amp, Urine: NEGATIVE

## 2012-05-03 LAB — URINE CULTURE: Organism ID, Bacteria: NO GROWTH

## 2012-07-03 ENCOUNTER — Ambulatory Visit (INDEPENDENT_AMBULATORY_CARE_PROVIDER_SITE_OTHER): Payer: BC Managed Care – PPO | Admitting: Emergency Medicine

## 2012-07-03 VITALS — BP 100/66 | HR 74 | Temp 97.9°F | Resp 16 | Ht 69.5 in | Wt 199.4 lb

## 2012-07-03 DIAGNOSIS — R221 Localized swelling, mass and lump, neck: Secondary | ICD-10-CM

## 2012-07-03 DIAGNOSIS — R220 Localized swelling, mass and lump, head: Secondary | ICD-10-CM

## 2012-07-03 LAB — POCT CBC
HCT, POC: 43.4 % — AB (ref 43.5–53.7)
Hemoglobin: 13.9 g/dL — AB (ref 14.1–18.1)
Lymph, poc: 1.9 (ref 0.6–3.4)
MCH, POC: 30.3 pg (ref 27–31.2)
MCHC: 32 g/dL (ref 31.8–35.4)
MCV: 94.8 fL (ref 80–97)
RBC: 4.58 M/uL — AB (ref 4.69–6.13)
WBC: 7.1 10*3/uL (ref 4.6–10.2)

## 2012-07-03 NOTE — Progress Notes (Signed)
[facility] [address], [place] [address] [phone]  Date:  07/03/2012   Name:  Joel [name]   DOB:  [birth year]   MRN:  [id number]  PCP:  Default, Provider, MD    Chief Complaint: Neck Pain   History of Present Illness:  Joel [name] is a 47 y.o. very pleasant male patient who presents with the following:  Has a mass in the right neck in the angle of the jaw.  Says he first felt it three months ago.  It is now tender and painful at times.  No fever or chills, night sweats, weight loss.  Non smoker.  No history of smokeless tobacco.  No contact with cats.  Denies other complaints.   Patient Active Problem List  Diagnosis  . GERD (gastroesophageal reflux disease)    Past Medical History  Diagnosis Date  . GERD (gastroesophageal reflux disease)   . Arthritis     No past surgical history on file.  History  Substance Use Topics  . Smoking status: Never Smoker   . Smokeless tobacco: Not on file  . Alcohol Use: 1.8 oz/week    3 Glasses of wine per week     Comment: social    Family History  Problem Relation Age of Onset  . Cerebral aneurysm Mother   . Alzheimer's disease Maternal Grandmother     No Known Allergies  Medication list has been reviewed and updated.  Current Outpatient Prescriptions on File Prior to Visit  Medication Sig Dispense Refill  . erythromycin ophthalmic ointment Apply a 1 cm ribbon 3 times a day  3.5 g  0  . levofloxacin (LEVAQUIN) 500 MG tablet Take 1 tablet (500 mg total) by mouth daily.  10 tablet  0  . meloxicam (MOBIC) 15 MG tablet Take 1 tablet (15 mg total) by mouth daily.  30 tablet  0  . Multiple Vitamin (MULITIVITAMIN WITH MINERALS) TABS Take 1 tablet by mouth daily.      Marland Kitchen omega-3 acid ethyl esters (LOVAZA) 1 G capsule Take 2 g by mouth daily.      Marland Kitchen omeprazole (PRILOSEC) 20 MG capsule Take 20 mg by mouth daily.      . pantoprazole (PROTONIX) 20 MG tablet Take 1 tablet (20 mg total) by mouth daily.   30 tablet  0   No current facility-administered medications on file prior to visit.    Review of Systems:  As per HPI, otherwise negative.    Physical Examination: Filed Vitals:   07/03/12 1202  BP: 100/66  Pulse: 74  Temp: 97.9 F (36.6 C)  Resp: 16   Filed Vitals:   07/03/12 1202  Height: 5' 9.5" (1.765 m)  Weight: 199 lb 6.4 oz (90.447 kg)   Body mass index is 29.03 kg/(m^2). Ideal Body Weight: Weight in (lb) to have BMI = 25: 171.4   GEN: WDWN, NAD, Non-toxic, Alert & Oriented x 3 HEENT: Atraumatic, Normocephalic.  Ears and Nose: No external deformity. EXTR: No clubbing/cyanosis/edema NEURO: Normal gait.  PSYCH: Normally interactive. Conversant. Not depressed or anxious appearing.  Calm demeanor.  NECK:  Firm mass right neck at angle of the jaw.  Seems mobile and not particularly tender.  Assessment and Plan: Neck mass CT Consult ENT  Carmelina Dane, MD

## 2012-07-09 ENCOUNTER — Telehealth: Payer: Self-pay

## 2012-07-09 ENCOUNTER — Ambulatory Visit
Admission: RE | Admit: 2012-07-09 | Discharge: 2012-07-09 | Disposition: A | Discharge status: Home / Self Care | Payer: BC Managed Care – PPO | Source: Ambulatory Visit | Attending: Emergency Medicine | Admitting: Emergency Medicine

## 2012-07-09 DIAGNOSIS — R9389 Abnormal findings on diagnostic imaging of other specified body structures: Secondary | ICD-10-CM

## 2012-07-09 DIAGNOSIS — R221 Localized swelling, mass and lump, neck: Secondary | ICD-10-CM

## 2012-07-09 MED ORDER — IOHEXOL 300 MG/ML  SOLN
75.0000 mL | Freq: Once | INTRAMUSCULAR | Status: AC | PRN
  Administered 2012-07-09: 75 mL via INTRAVENOUS

## 2012-07-09 NOTE — Telephone Encounter (Signed)
Pt would like referral to an ent md [phone]

## 2012-07-10 ENCOUNTER — Telehealth: Payer: Self-pay | Admitting: Family Medicine

## 2012-07-10 NOTE — Telephone Encounter (Signed)
Pt advised order put in for the ENT office.

## 2012-07-10 NOTE — Telephone Encounter (Signed)
Please let her know that I just received her CT result.  Radiologist says it is a swollen node in her parotid gland and suggests a repeat CT with contrast if it does not resolve. Suggest follow up in one month or she can go see an ENT.  You can set up a referral if she requests it.  Thanks

## 2012-07-10 NOTE — Telephone Encounter (Signed)
Patient called to check on his CBC gave him results

## 2012-11-09 ENCOUNTER — Ambulatory Visit (INDEPENDENT_AMBULATORY_CARE_PROVIDER_SITE_OTHER): Payer: BC Managed Care – PPO | Admitting: Family Medicine

## 2012-11-09 VITALS — BP 120/74 | HR 73 | Temp 98.3°F | Resp 18 | Ht 71.0 in | Wt 204.0 lb

## 2012-11-09 DIAGNOSIS — Z114 Encounter for screening for human immunodeficiency virus [HIV]: Secondary | ICD-10-CM

## 2012-11-09 DIAGNOSIS — Z1159 Encounter for screening for other viral diseases: Secondary | ICD-10-CM

## 2012-11-09 DIAGNOSIS — R0981 Nasal congestion: Secondary | ICD-10-CM

## 2012-11-09 DIAGNOSIS — Z9109 Other allergy status, other than to drugs and biological substances: Secondary | ICD-10-CM

## 2012-11-09 DIAGNOSIS — J3489 Other specified disorders of nose and nasal sinuses: Secondary | ICD-10-CM

## 2012-11-09 MED ORDER — FLUTICASONE PROPIONATE 50 MCG/ACT NA SUSP: 2.0000 | Freq: Every day | NASAL | Status: DC

## 2012-11-09 MED ORDER — ALBUTEROL SULFATE HFA 108 (90 BASE) MCG/ACT IN AERS: 2.0000 | INHALATION_SPRAY | Freq: Four times a day (QID) | RESPIRATORY_TRACT | Status: DC | PRN

## 2012-11-09 NOTE — Patient Instructions (Addendum)
Use the nasal spray daily as directed.   Try some OTC claritin or zyrtec. Use the albuterol inhaler if needed for chest tightness and wheezing If your allergy symptoms are not improved in a week or so please let me know. I will be in touch with your labs asap

## 2012-11-09 NOTE — Progress Notes (Signed)
[facility] [address], [place] [address] [phone]  Date:  11/09/2012   Name:  Joel [name]   DOB:  [birth year]   MRN:  [id number]  PCP:  Default, Provider, MD    Chief Complaint: HIV TESTING and Cough   History of Present Illness:  Joel [name] is a 47 y.o. 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 Denies any concern of exposure. Also states that his eyes were teary eyed and nose was painful to touch about a week ago. Patient states that he does not have a history of allergies. He mentions that he is coughing this week. Patient states that he feels like he has shortness of breath about 2 a day- usually when sitting. No provoking or relieving factors. Patient has a history of exercise induced asthma as a child. Does not use an inhaler at this time. Denies palpations.  Patient Active Problem List   Diagnosis Date Noted  . GERD (gastroesophageal reflux disease) 01/26/2012    Past Medical History  Diagnosis Date  . GERD (gastroesophageal reflux disease)   . Arthritis   . Anxiety     History reviewed. No pertinent past surgical history.  History  Substance Use Topics  . Smoking status: Never Smoker   . Smokeless tobacco: Not on file  . Alcohol Use: 1.8 oz/week    3 Glasses of wine per week     Comment: social    Family History  Problem Relation Age of Onset  . Cerebral aneurysm Mother   . Alzheimer's disease Maternal Grandmother     No Known Allergies  Medication list has been reviewed and updated.  Current Outpatient Prescriptions on File Prior to Visit  Medication Sig Dispense Refill  . Multiple Vitamin (MULITIVITAMIN WITH MINERALS) TABS Take 1 tablet by mouth daily.      Marland Kitchen erythromycin ophthalmic ointment Apply a 1 cm ribbon 3 times a day  3.5 g  0  . levofloxacin (LEVAQUIN) 500 MG tablet Take 1 tablet (500 mg total) by mouth daily.  10 tablet   0  . omega-3 acid ethyl esters (LOVAZA) 1 G capsule Take 2 g by mouth daily.      Marland Kitchen omeprazole (PRILOSEC) 20 MG capsule Take 20 mg by mouth daily.       No current facility-administered medications on file prior to visit.    Review of Systems   Constitutional: Denies malaise, and tremor. Has difficulty falling asleep at night (1-2 times/wk) HEENT: Denies headache, tinnitus, visual changes.  CV: Denies chest pain, edema, syncope Resp: Denies shortness of breath GI: Able to tolerate foods/fluids well, no problems with bowel and bladder GU:  No complaints MUSC: Denies joint/ muscle pain Skin: No concerns Neuro: No complaints Allergies: NKDA  Physical Examination: Filed Vitals:   11/09/12 1553  BP: 120/74  Pulse: 73  Temp: 98.3 F (36.8 C)  Resp: 18   Filed Vitals:   11/09/12 1553  Height: 5\' 11"  (1.803 m)  Weight: 204 lb (92.534 kg)   Body mass index is 28.46 kg/(m^2). Ideal Body Weight: Weight in (lb) to have BMI = 25: 178.9  GEN: WDWN, NAD, Non-toxic, A & O x 4, looks well HEENT: Atraumatic, Normocephalic. Neck supple. No masses, No LAD. Mucous membranes moist.   Oropharynx wnl.  Ears and Nose: No external deformity. TM's unable to be visualized due  to waxy build up, nasal cavity unremarkable CV: RRR, No M/G/R. No JVD. No thrill. No extra heart sounds. PULM: CTA B, no wheezes, crackles, rhonchi. No retractions. No resp. distress. No accessory muscle use. EXTR: No c/c/e NEURO Normal gait.  PSYCH: Normally interactive. Conversant. Not depressed or anxious appearing.  Calm demeanor.    Assessment and Plan:  Assessment- HIV screening                        Environmental Allergies                       Nasal congestion  Plan- HIV antibody screening           Flonase prn           Albuterol inhaler prn for chest tightness   Follow up as needed with worsening symptoms   Signed Abbe Amsterdam, MD

## 2012-11-10 ENCOUNTER — Encounter: Payer: Self-pay | Admitting: Family Medicine

## 2012-11-14 ENCOUNTER — Telehealth: Payer: Self-pay

## 2012-11-14 NOTE — Telephone Encounter (Signed)
Patient calling back to get HIV results   Best number: [phone] (M)

## 2012-11-15 NOTE — Telephone Encounter (Signed)
Letter was sent. Still unable to reach by phone.

## 2012-12-04 ENCOUNTER — Ambulatory Visit (INDEPENDENT_AMBULATORY_CARE_PROVIDER_SITE_OTHER): Payer: BC Managed Care – PPO | Admitting: Emergency Medicine

## 2012-12-04 VITALS — BP 106/70 | HR 69 | Temp 98.2°F | Resp 16 | Ht 70.5 in | Wt 204.8 lb

## 2012-12-04 DIAGNOSIS — R300 Dysuria: Secondary | ICD-10-CM

## 2012-12-04 LAB — POCT UA - MICROSCOPIC ONLY
Bacteria, U Microscopic: NEGATIVE
Casts, Ur, LPF, POC: NEGATIVE
Crystals, Ur, HPF, POC: NEGATIVE
Mucus, UA: NEGATIVE

## 2012-12-04 LAB — POCT URINALYSIS DIPSTICK
Bilirubin, UA: NEGATIVE
Blood, UA: NEGATIVE
pH, UA: 7

## 2012-12-04 LAB — RPR

## 2012-12-04 NOTE — Progress Notes (Addendum)
[facility] [address], [place] [address] [phone]  Date:  12/04/2012   Name:  KERT SHACKETT   DOB:  05-28-1965   MRN:  [id number]  PCP:  Default, Provider, MD    Chief Complaint: painful urination   History of Present Illness:  Joel [name] is a 47 y.o. very pleasant male patient who presents with the following:  Monogamous for 39 years married man with dysuria past few days.  He is concerned by several "spots" on his glans.  His wife was recently treated for a yeast infection and cystitis.  Very remote history of STD.  No improvement with over the counter medications or other home remedies. Denies other complaint or health concern today.   Patient Active Problem List   Diagnosis Date Noted  . GERD (gastroesophageal reflux disease) 01/26/2012    Past Medical History  Diagnosis Date  . GERD (gastroesophageal reflux disease)   . Arthritis   . Anxiety     No past surgical history on file.  History  Substance Use Topics  . Smoking status: Never Smoker   . Smokeless tobacco: Not on file  . Alcohol Use: 1.8 oz/week    3 Glasses of wine per week     Comment: social    Family History  Problem Relation Age of Onset  . Cerebral aneurysm Mother   . Alzheimer's disease Maternal Grandmother     No Known Allergies  Medication list has been reviewed and updated.  Current Outpatient Prescriptions on File Prior to Visit  Medication Sig Dispense Refill  . albuterol (PROVENTIL HFA;VENTOLIN HFA) 108 (90 BASE) MCG/ACT inhaler Inhale 2 puffs into the lungs every 6 (six) hours as needed for wheezing.  1 Inhaler  0  . fluticasone (FLONASE) 50 MCG/ACT nasal spray Place 2 sprays into the nose daily.  16 g  6  . Multiple Vitamin (MULITIVITAMIN WITH MINERALS) TABS Take 1 tablet by mouth daily.      Marland Kitchen omega-3 acid ethyl esters (LOVAZA) 1 G capsule Take 2 g by mouth daily.      Marland Kitchen omeprazole (PRILOSEC) 20 MG capsule Take 20 mg by mouth daily.       No  current facility-administered medications on file prior to visit.    Review of Systems:  As per HPI, otherwise negative.    Physical Examination: Filed Vitals:   12/04/12 0900  BP: 106/70  Pulse: 69  Temp: 98.2 F (36.8 C)  Resp: 16   Filed Vitals:   12/04/12 0900  Height: 5' 10.5" (1.791 m)  Weight: 204 lb 12.8 oz (92.897 kg)   Body mass index is 28.96 kg/(m^2). Ideal Body Weight: Weight in (lb) to have BMI = 25: 176.4   GEN: WDWN, NAD, Non-toxic, Alert & Oriented x 3 HEENT: Atraumatic, Normocephalic.  Ears and Nose: No external deformity. EXTR: No clubbing/cyanosis/edema NEURO: Normal gait.  PSYCH: Normally interactive. Conversant. Not depressed or anxious appearing.  Calm demeanor.  Genitalia:  Not circumcised.  Two small lesions on glans likely candidal.    Assessment and Plan: Dysuria Labs   Signed,  Phillips Odor, MD   Results for orders placed in visit on 12/04/12  POCT URINALYSIS DIPSTICK      Result Value Range   Color, UA yellow     Clarity, UA clear     Glucose, UA neg     Bilirubin, UA neg     Ketones, UA eng     Spec Grav,  UA 1.020     Blood, UA neg     pH, UA 7.0     Protein, UA neg     Urobilinogen, UA 0.2     Nitrite, UA neg     Leukocytes, UA Negative    POCT UA - MICROSCOPIC ONLY      Result Value Range   WBC, Ur, HPF, POC 0-1     RBC, urine, microscopic beg     Bacteria, U Microscopic neg     Mucus, UA neg     Epithelial cells, urine per micros neg     Crystals, Ur, HPF, POC neg     Casts, Ur, LPF, POC neg     Yeast, UA neg

## 2012-12-07 ENCOUNTER — Telehealth: Payer: Self-pay

## 2012-12-07 NOTE — Telephone Encounter (Signed)
Patient would like someone to call him with his lab results. 

## 2012-12-07 NOTE — Telephone Encounter (Signed)
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 Otherwise negative

## 2013-08-23 ENCOUNTER — Ambulatory Visit (INDEPENDENT_AMBULATORY_CARE_PROVIDER_SITE_OTHER): Payer: BC Managed Care – PPO | Admitting: Family Medicine

## 2013-08-23 ENCOUNTER — Encounter: Payer: Self-pay | Admitting: Family Medicine

## 2013-08-23 ENCOUNTER — Ambulatory Visit: Payer: BC Managed Care – PPO

## 2013-08-23 VITALS — BP 109/70 | HR 67 | Temp 97.0°F | Resp 16 | Ht 70.5 in | Wt 213.0 lb

## 2013-08-23 DIAGNOSIS — M25562 Pain in left knee: Principal | ICD-10-CM

## 2013-08-23 DIAGNOSIS — M25561 Pain in right knee: Principal | ICD-10-CM

## 2013-08-23 DIAGNOSIS — Z9109 Other allergy status, other than to drugs and biological substances: Secondary | ICD-10-CM

## 2013-08-23 DIAGNOSIS — J45909 Unspecified asthma, uncomplicated: Secondary | ICD-10-CM | POA: Insufficient documentation

## 2013-08-23 DIAGNOSIS — G8929 Other chronic pain: Secondary | ICD-10-CM

## 2013-08-23 DIAGNOSIS — M25569 Pain in unspecified knee: Secondary | ICD-10-CM

## 2013-08-23 MED ORDER — ALBUTEROL SULFATE HFA 108 (90 BASE) MCG/ACT IN AERS: 2.0000 | INHALATION_SPRAY | Freq: Four times a day (QID) | RESPIRATORY_TRACT | Status: DC | PRN

## 2013-08-23 NOTE — Patient Instructions (Signed)
Knee Pain Knee pain can be a result of an injury or other medical conditions. Treatment will depend on the cause of your pain. HOME CARE  Only take medicine as told by your doctor.  Keep a healthy weight. Being overweight can make the knee hurt more.  Stretch before exercising or playing sports.  If there is constant knee pain, change the way you exercise. Ask your doctor for advice.  Make sure shoes fit well. Choose the right shoe for the sport or activity.  Protect your knees. Wear kneepads if needed.  Rest when you are tired. GET HELP RIGHT AWAY IF:   Your knee pain does not stop.  Your knee pain does not get better.  Your knee joint feels hot to the touch.  You have a fever. MAKE SURE YOU:   Understand these instructions.  Will watch this condition.  Will get help right away if you are not doing well or get worse. Document Released: 07/08/2008 Document Revised: 07/04/2011 Document Reviewed: 07/08/2008 [facility] Patient Information 2014 [place].   If you do not want to take pain medication or medication like Aleve or Tylenol, tart cherry juice has been known to reduce joint pain.  Also spices like tumeric, ginger and garlic are helpful.  Continue to ice the joint or apply a topical analgesic like Tiger Balm, Flexall or Ben-Gay.

## 2013-08-23 NOTE — Progress Notes (Signed)
S:  This 48 y.o. AA male has chronic bilateral knee pain; ACL repair in L knee at age 48. Today, he c/o persistent R knee pain w/ stiffness, catching sensation and swelling. AFter prolonged sitting, pain increases w/ first step but then pain disappears. He does not want to take any oral pain medication, not even NSAIDs. Icing joint has helped.  Pt also has mild intermittent Asthma associated w/ allergies; he requests refill of MDI. He denies fever, sore throat, rhinorrhea, sneezing, SOB or DOE, chronic cough, wheezing, nocturnal symptoms, CP or tightness, HA or weakness.  Patient Active Problem List   Diagnosis Date Noted  . Bilateral chronic knee pain 08/23/2013  . Asthma with allergic rhinitis 08/23/2013  . GERD (gastroesophageal reflux disease) 01/26/2012   Prior to Admission medications   Medication Sig Start Date End Date Taking? Authorizing Provider  albuterol (PROVENTIL HFA;VENTOLIN HFA) 108 (90 BASE) MCG/ACT inhaler Inhale 2 puffs into the lungs every 6 (six) hours as needed for wheezing.   Yes   Multiple Vitamin (MULITIVITAMIN WITH MINERALS) TABS Take 1 tablet by mouth daily.   Yes Historical Provider, MD  omega-3 acid ethyl esters (LOVAZA) 1 G capsule Take 2 g by mouth daily.   Yes Historical Provider, MD   PMHx, Surg Hx, Soc and Fam Hx reviewed.   ROS: As per HPI. Otherwise unremarkable.   O: Filed Vitals:   08/23/13 1327  BP: 109/70  Pulse: 67  Temp: 97 F (36.1 C)  Resp: 16   GEN: In NAD; WN,WD. HEENT: [place]/AT. EOMI w/ clear conj/sclerae. EACs/canals/TMs normal. Nares unremarkable. Post ph w/ mild erythema but no lesions or exudate. NECK: Supple. COR: RRR. LUNGS: CTA; no wheezes or rhonchi. Normal resp rate and effort. SKIN: W&D; intact. MS: L knee- Normal appearance w/o effusion, warmth or deformity. No ligament laxity. Decreased ROM; tenderness at medial joint line along tibial plateau. Able to bear weight.  R knee- Normal appearance, and FROM.  Remainder of exam  unremarkable. NEURO: A&O x 3; CNs intact. Nonfocal.   [facility] reading (PRIMARY) by  Dr. Audria NineMcPherson: R knee- Good joint space preservation, minimal early osteophyte formation. No fracture or dislocation. No abnormal lesions in bones.  A/P: Bilateral chronic knee pain - Plan: DG Knee 1-2 Views Right, Ambulatory referral to Orthopedic Surgery  Asthma with allergic rhinitis- RF Albuterol MDI for prn use. Pt declines antihistamine.  Environmental allergies - Plan: albuterol (PROVENTIL HFA;VENTOLIN HFA) 108 (90 BASE) MCG/ACT inhaler  Chronic pain of right knee - Pt declines to use NSAIDs or other analgesic. He states he wants the problem to be fixed. Plan: Ambulatory referral to Orthopedic Surgery

## 2013-11-04 ENCOUNTER — Ambulatory Visit (INDEPENDENT_AMBULATORY_CARE_PROVIDER_SITE_OTHER): Payer: BC Managed Care – PPO | Admitting: Family Medicine

## 2013-11-04 ENCOUNTER — Ambulatory Visit: Payer: BC Managed Care – PPO

## 2013-11-04 VITALS — BP 108/70 | HR 68 | Temp 97.6°F | Resp 16 | Ht 70.25 in | Wt 199.8 lb

## 2013-11-04 DIAGNOSIS — M25532 Pain in left wrist: Secondary | ICD-10-CM

## 2013-11-04 DIAGNOSIS — M25539 Pain in unspecified wrist: Secondary | ICD-10-CM

## 2013-11-04 NOTE — Progress Notes (Signed)
[facility] [address] [place] [address] [phone]  Date:  11/04/2013   Name:  Joel [name]   DOB:  04-16-1966   MRN:  [id number]  PCP:  Default, Provider, MD    Chief Complaint: Wrist Injury   History of Present Illness:  Joel [name] is a 48 y.o. very pleasant male patient who presents with the following:  He has had left wrist pain for about one month. He thinks it might be related to working out but cannot recall any particular injury.   Just the left wrist is a problem. He has some pain with ROM.   He has noted a bump on the dorsum of the wrist for a couple of days.  It is sore but not terribly painful.    He is otherwise generally in good health. He has had some knee pain and was supposed to be referred to ortho but it seems this never did happen.  He would still like to see ortho for his knees    Patient Active Problem List   Diagnosis Date Noted  . Bilateral chronic knee pain 08/23/2013  . Asthma with allergic rhinitis 08/23/2013  . GERD (gastroesophageal reflux disease) 01/26/2012    Past Medical History  Diagnosis Date  . GERD (gastroesophageal reflux disease)   . Arthritis   . Anxiety     History reviewed. No pertinent past surgical history.  History  Substance Use Topics  . Smoking status: Never Smoker   . Smokeless tobacco: Not on file  . Alcohol Use: 1.8 oz/week    3 Glasses of wine per week     Comment: social    Family History  Problem Relation Age of Onset  . Cerebral aneurysm Mother   . Alzheimer's disease Maternal Grandmother     No Known Allergies  Medication list has been reviewed and updated.  Current Outpatient Prescriptions on File Prior to Visit  Medication Sig Dispense Refill  . Multiple Vitamin (MULITIVITAMIN WITH MINERALS) TABS Take 1 tablet by mouth daily.      Marland Kitchen albuterol (PROVENTIL HFA;VENTOLIN HFA) 108 (90 BASE) MCG/ACT inhaler Inhale 2 puffs into the lungs every 6 (six) hours as needed for  wheezing.  1 Inhaler  5  . omega-3 acid ethyl esters (LOVAZA) 1 G capsule Take 2 g by mouth daily.       No current facility-administered medications on file prior to visit.    Review of Systems:  As per HPI- otherwise negative.   Physical Examination: Filed Vitals:   11/04/13 1356  BP: 108/70  Pulse: 68  Temp: 97.6 F (36.4 C)  Resp: 16   Filed Vitals:   11/04/13 1356  Height: 5' 10.25" (1.784 m)  Weight: 199 lb 12.8 oz (90.629 kg)   Body mass index is 28.48 kg/(m^2). Ideal Body Weight: Weight in (lb) to have BMI = 25: 175.1  GEN: WDWN, NAD, Non-toxic, A & O x 3, looks well HEENT: Atraumatic, Normocephalic. Neck supple. No masses, No LAD. Ears and Nose: No external deformity. CV: RRR, No M/G/R. No JVD. No thrill. No extra heart sounds. PULM: CTA B, no wheezes, crackles, rhonchi. No retractions. No resp. distress. No accessory muscle use. EXTR: No c/c/e NEURO Normal gait.  PSYCH: Normally interactive. Conversant. Not depressed or anxious appearing.  Calm demeanor.  Left wrist: he has a small, soft cystic structure over the dorsal wrist.  Otherwise the wrist exam is normal, normal flexion and extension.  No  other swelling, heat or redness   [facility] reading (PRIMARY) by  Dr. Patsy Lageropland. Left wrist: normal film  LEFT WRIST - COMPLETE 3+ VIEW  COMPARISON: None.  FINDINGS: The bones of the left wrist are adequately mineralized. There is no acute fracture nor dislocation. There is mild soft tissue swelling dorsally.  IMPRESSION: There is no acute bony abnormality of the left wrist nor significant degenerative change.  Placed in a non- spica splint for the left wrist.  Assessment and Plan: Left wrist pain - Plan: DG Wrist Complete Left, Ambulatory referral to Orthopedic Surgery  Left wrist pain of undetermined etiology.  Paced in a splint to use prn for comfort.   Will refer to ortho for further evaluation   Signed Abbe AmsterdamJessica Copland, MD

## 2013-11-04 NOTE — Patient Instructions (Signed)
We will get you in to see orthopedics for your knees and your wrist.  If you do not hear about this appointment in the next 7- 10 days please let me know

## 2014-02-13 ENCOUNTER — Ambulatory Visit (INDEPENDENT_AMBULATORY_CARE_PROVIDER_SITE_OTHER): Payer: BC Managed Care – PPO | Admitting: Family Medicine

## 2014-02-13 VITALS — BP 120/78 | HR 78 | Temp 99.1°F | Resp 12 | Ht 70.0 in | Wt 204.0 lb

## 2014-02-13 DIAGNOSIS — Z111 Encounter for screening for respiratory tuberculosis: Secondary | ICD-10-CM

## 2014-02-13 NOTE — Progress Notes (Signed)
  Tuberculosis Risk Questionnaire  1. No Were you born outside the [place] in one of the following parts of the world: [place], [place], [place], [place] or [place]?    2. No Have you traveled outside the [place] and lived for more than one month in one of the following parts of the world: [place], [place], [place], [place] or [place]?    3. No Do you have a compromised immune system such as from any of the following conditions:HIV/AIDS, organ or bone marrow transplantation, diabetes, immunosuppressive medicines (e.g. Prednisone, Remicaide), leukemia, lymphoma, cancer of the head or neck, gastrectomy or jejunal bypass, end-stage renal disease (on dialysis), or silicosis?     4. No Have you ever or do you plan on working in: a residential care center, a health care facility, a jail or prison or homeless shelter?    5. No Have you ever: injected illegal drugs, used crack cocaine, lived in a homeless shelter  or been in jail or prison?     6. No Have you ever been exposed to anyone with infectious tuberculosis?    Tuberculosis Symptom Questionnaire  Do you currently have any of the following symptoms?  1. No Unexplained cough lasting more than 3 weeks?   2. No Unexplained fever lasting more than 3 weeks.   3. No Night Sweats (sweating that leaves the bedclothes and sheets wet)     4. No Shortness of Breath   5. No Chest Pain   6. No Unintentional weight loss    7. 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 Paragraph objective: No acute distress No adenopathy Respirations normal  Assessment: Low risk for tuberculosis Plan: TB skin test to be read Sunday morning.  Signed, Sheila OatsKurt Edilson Vital M.D.

## 2014-10-06 ENCOUNTER — Ambulatory Visit (INDEPENDENT_AMBULATORY_CARE_PROVIDER_SITE_OTHER): Payer: BC Managed Care – PPO | Admitting: Family Medicine

## 2014-10-06 VITALS — BP 126/80 | HR 88 | Temp 98.3°F | Resp 17 | Ht 70.5 in | Wt 215.0 lb

## 2014-10-06 DIAGNOSIS — J3489 Other specified disorders of nose and nasal sinuses: Secondary | ICD-10-CM

## 2014-10-06 NOTE — Patient Instructions (Addendum)
Please report to [address] [place]   This building is located on the corner of [address] across of [facility] in the back of the building and enter in the front door that [employer]     We are referring you to your nose and throat specialist because these small bumps often bleed and cautery as needed to ensure good hemostasis.

## 2014-10-06 NOTE — Progress Notes (Signed)
This 49 year old gentleman has a growth in his right nostril that began as a scaly irritated bump and has progressed to a red fleshy nodule just inside the opening of his right nasal passage. Has been very friable at times and uncomfortable.  Objective: Patient has a 4 mm granulomatous erythematos papule in the right nasal passage opening.  Assessment: Granuloma right nasal passage which has become quite friable and needs cautery and/or excision with appropriate hemostatic back up.  Plan: Referral to ENT  Signed, Elvina Sidle MD

## 2015-07-18 IMAGING — CR DG KNEE 1-2V*R*
2 series · 2 of 2 positions shown · non-contrast
Comparison: None.

CLINICAL DATA: Chronic knee pain

EXAM:
RIGHT KNEE - 1-2 VIEW

[AP]
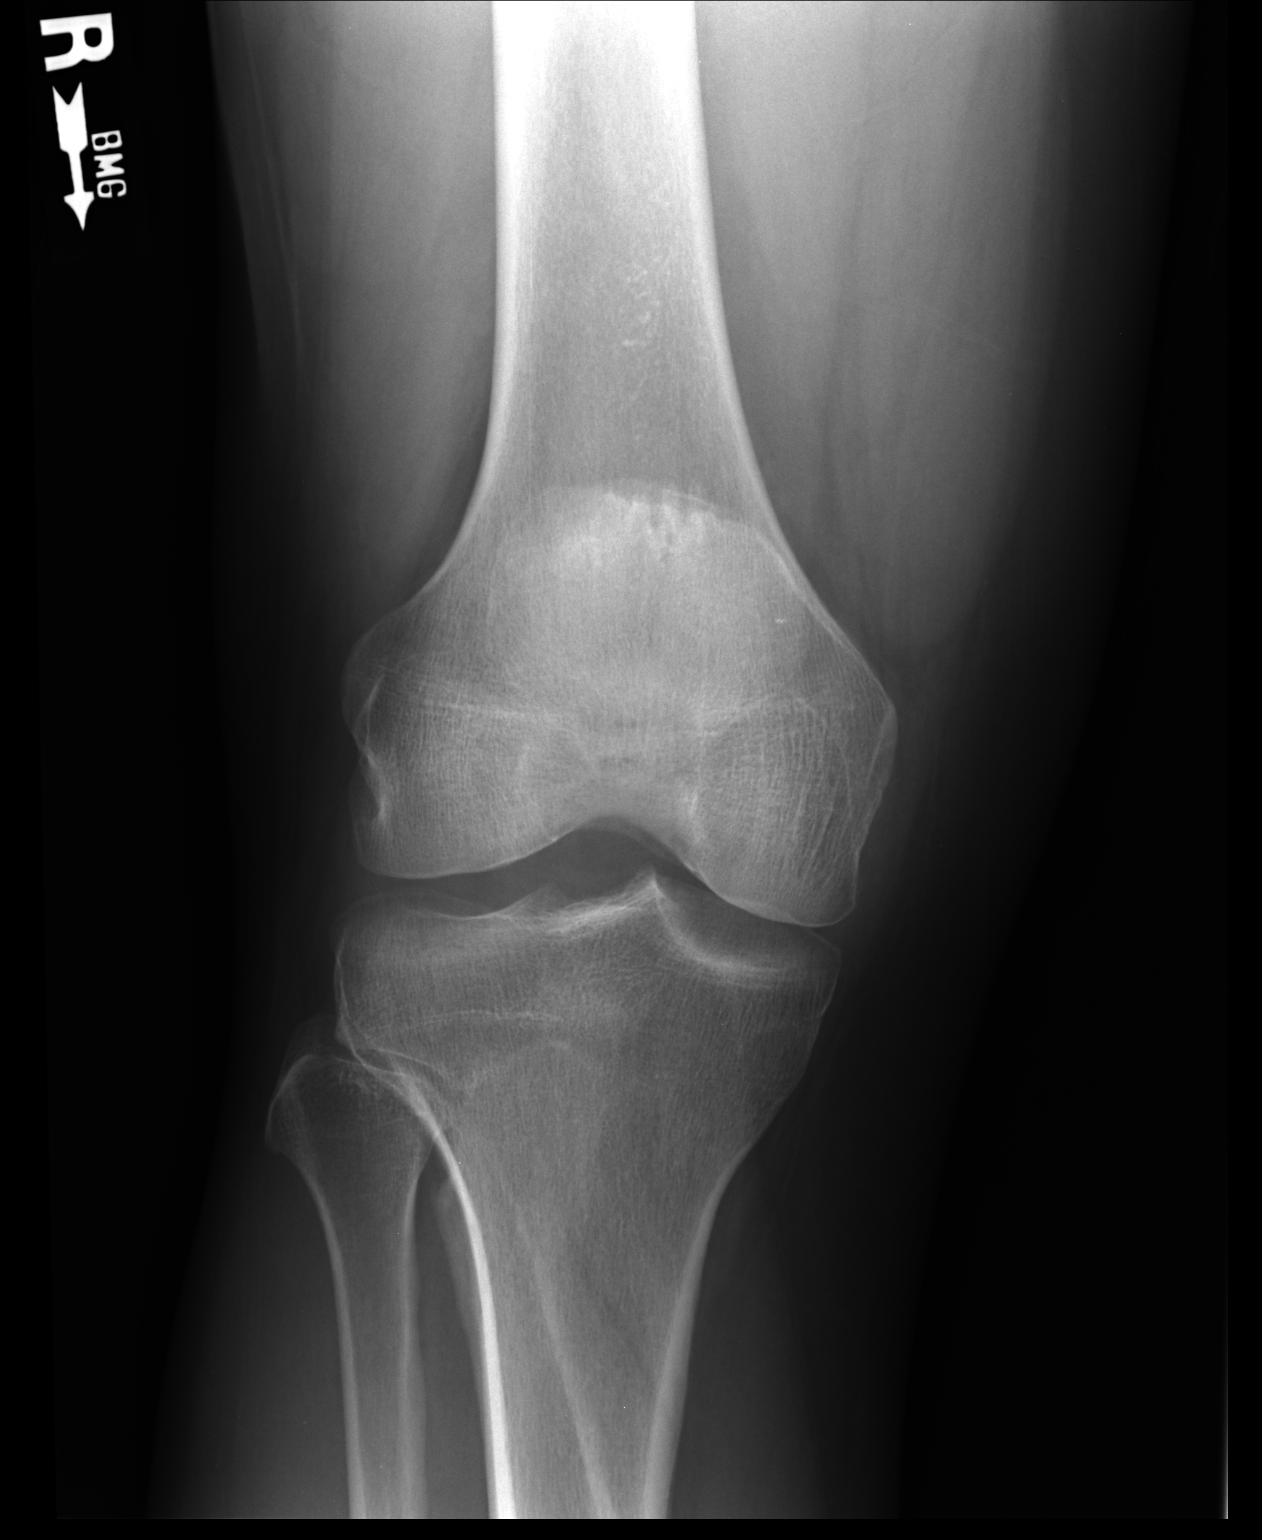

[lateral]
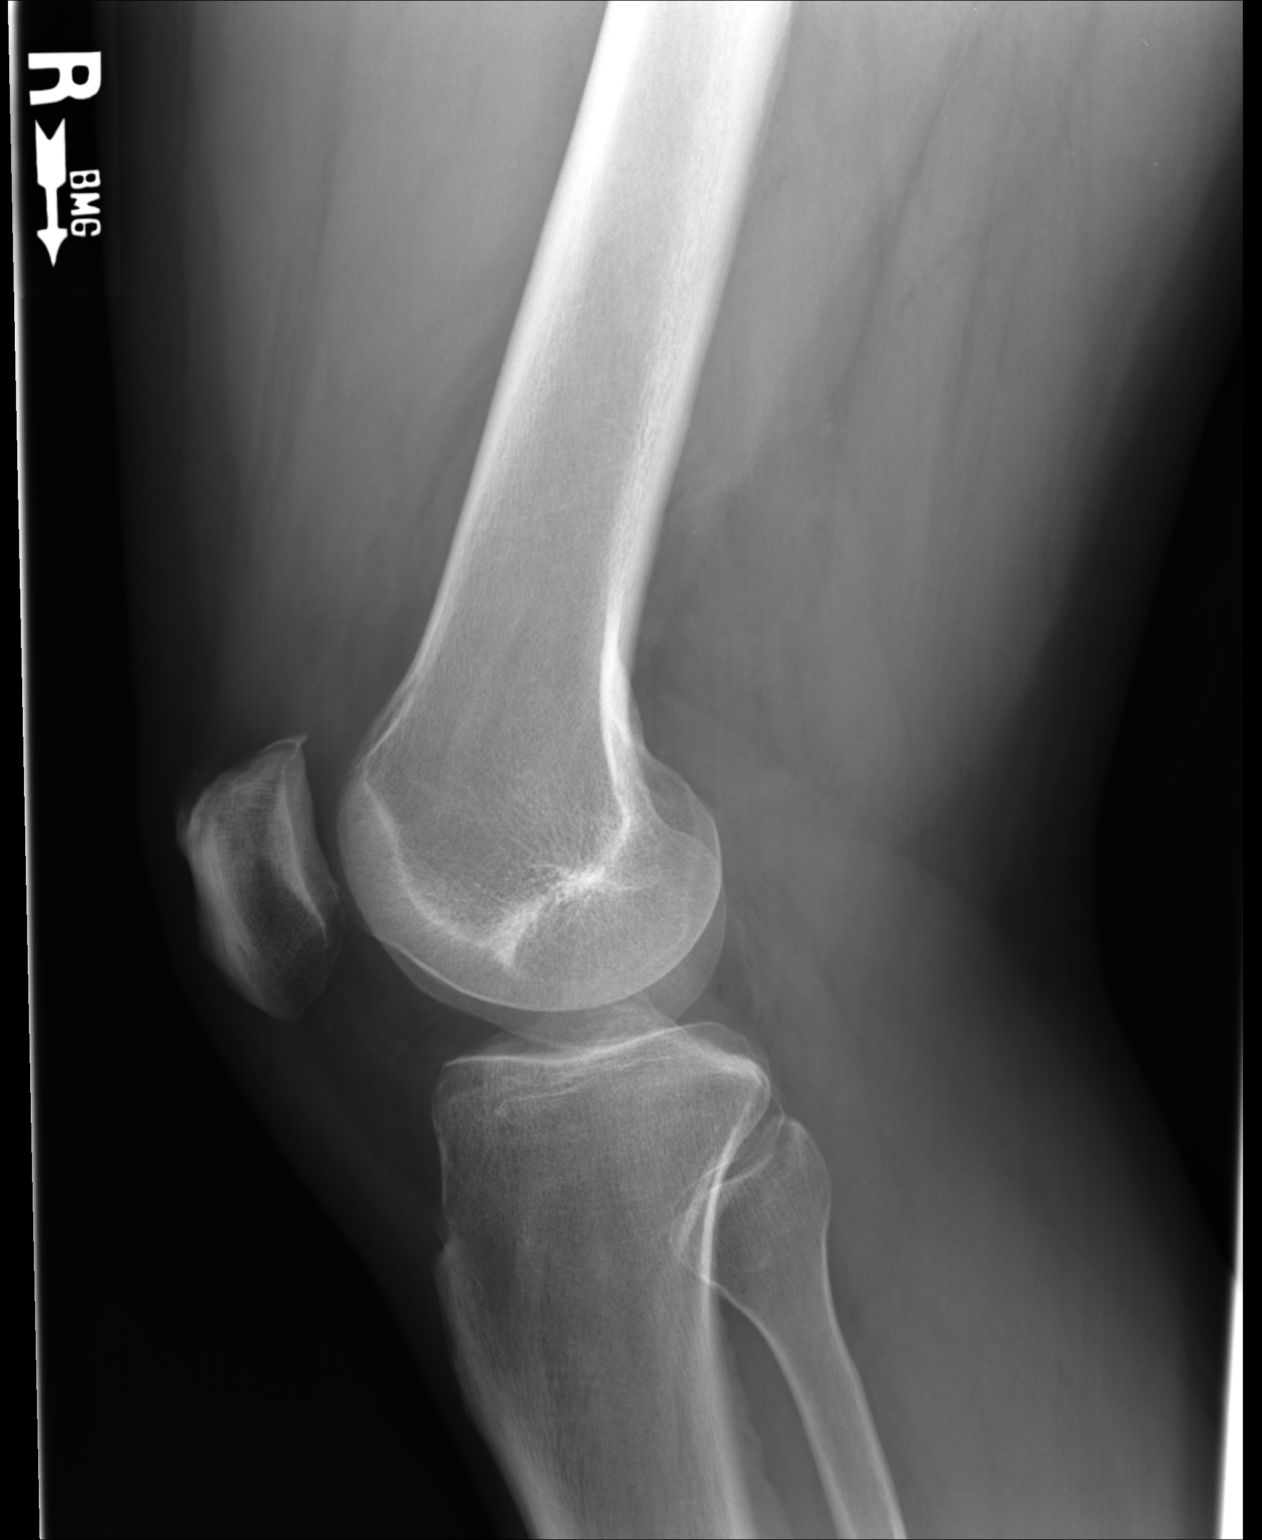

[2 of 2 positions shown; findings below may reference images not displayed]

FINDINGS: Frontal and lateral views were obtained. There is no fracture,
dislocation, or effusion. There is no appreciable joint space
narrowing. There is mild spurring arising from the superior patella.
IMPRESSION: Minimal osteoarthritic change.  No fracture or effusion.

## 2017-05-01 ENCOUNTER — Other Ambulatory Visit: Payer: Self-pay

## 2017-05-01 ENCOUNTER — Ambulatory Visit (HOSPITAL_COMMUNITY)
Admission: EM | Admit: 2017-05-01 | Discharge: 2017-05-01 | Disposition: A | Discharge status: Home / Self Care | Payer: BC Managed Care – PPO

## 2017-05-01 ENCOUNTER — Encounter (HOSPITAL_COMMUNITY): Payer: Self-pay | Admitting: Emergency Medicine

## 2017-05-01 DIAGNOSIS — B9789 Other viral agents as the cause of diseases classified elsewhere: Secondary | ICD-10-CM | POA: Diagnosis not present

## 2017-05-01 DIAGNOSIS — R0602 Shortness of breath: Secondary | ICD-10-CM

## 2017-05-01 DIAGNOSIS — J069 Acute upper respiratory infection, unspecified: Secondary | ICD-10-CM

## 2017-05-01 MED ORDER — CETIRIZINE HCL 10 MG PO TABS: 10.0000 mg | ORAL_TABLET | Freq: Every day | ORAL | 11 refills | Status: DC

## 2017-05-01 MED ORDER — ALBUTEROL SULFATE HFA 108 (90 BASE) MCG/ACT IN AERS: 1.0000 | INHALATION_SPRAY | Freq: Four times a day (QID) | RESPIRATORY_TRACT | 0 refills | Status: DC | PRN

## 2017-05-01 MED ORDER — PSEUDOEPHEDRINE HCL 60 MG PO TABS: 60.0000 mg | ORAL_TABLET | Freq: Three times a day (TID) | ORAL | 0 refills | Status: DC | PRN

## 2017-05-01 NOTE — ED Provider Notes (Signed)
  MRN: [id number] DOB: Aug 02, 1965  Subjective:   Joel HarnessJesse Tift Jr. is a 52 y.o. male presenting for 4 day history of mild cough, shob. Initially, patient had 1 week symptoms of sinus congestion, sinus pain, fever, nausea without vomiting, hoarseness. Sinus pain and fever have resolved. Tried Mucinex, NyQuil severe cold/flu. Has to sleep on multiple pillows, wakes up gasping for air. Denies dyspnea, chest pain, abdominal pain, lower leg swelling. Denies smoking cigarettes. Has a history of allergies, managed with benadryl. Denies history of HTN.  Joel [name] has No Known Allergies.  Joel [name]  has a past medical history of Anxiety, Arthritis, and GERD (gastroesophageal reflux disease). Has a history of corrected deviated septum. His family history includes Alzheimer's disease in his maternal grandmother; Cerebral aneurysm in his mother.   Objective:   Vitals: BP (!) 143/86 (BP Location: Right Arm)   Pulse 77   Temp 98.1 F (36.7 C) (Oral)   SpO2 98%   BP Readings from Last 3 Encounters:  05/01/17 (!) 143/86  10/06/14 126/80  02/13/14 120/78    Physical Exam  Constitutional: He is oriented to person, place, and time. He appears well-developed and well-nourished.  HENT:  TM's intact bilaterally, no effusions or erythema. Nasal turbinates boggy, nasal passages patent. Oropharynx moderate post-nasal drainage, mucous membranes moist.   Eyes: Right eye exhibits no discharge. Left eye exhibits no discharge. No scleral icterus.  Cardiovascular: Normal rate, regular rhythm and intact distal pulses. Exam reveals no gallop and no friction rub.  No murmur heard. Pulmonary/Chest: No respiratory distress. He has no wheezes. He has no rales.  Musculoskeletal: He exhibits no edema.  Neurological: He is alert and oriented to person, place, and time.  Skin: Skin is warm and dry.  Psychiatric: He has a normal mood and affect.   Assessment and Plan :   Viral URI with cough  Shortness of breath   Physical exam  findings reassuring. Will manage as an upper respiratory infection likely due to virus. Recommended supportive care, use albuterol for shob. Return-to-clinic precautions discussed, patient verbalized understanding. Counseled on possibility of sleep apnea given paroxysmal nocturnal dyspnea, elevated blood pressure. He will f/u with PCP for this.   Wallis BambergMani, Tauri Ethington, PA-C 05/01/17 1409

## 2017-05-01 NOTE — Discharge Instructions (Signed)
For sore throat try using a honey-based tea. Use 3 teaspoons of honey with juice squeezed from half lemon. Place shaved pieces of ginger into 1/2-1 cup of water and warm over stove top. Then mix the ingredients and repeat every 4 hours as needed. 

## 2017-05-01 NOTE — ED Triage Notes (Addendum)
Pt states he had a cold a week ago.  Three days he started having issues with SOB with any ADL's and he is having to sleep with 6 pillows at night in order to sleep but he still wakes up feeling like he needs air.  He is able to bend over with no SOB.  When he feels SOB he states his HR accelerates.  He still sounds like he has some nasal congestion and drainage and he states he has been hoarse for several days.

## 2018-06-25 ENCOUNTER — Ambulatory Visit: Payer: BC Managed Care – PPO | Admitting: Emergency Medicine

## 2018-08-20 ENCOUNTER — Telehealth: Payer: Self-pay | Admitting: Emergency Medicine

## 2018-08-20 ENCOUNTER — Ambulatory Visit: Payer: Self-pay | Admitting: Emergency Medicine

## 2018-08-20 NOTE — Telephone Encounter (Signed)
08/20/2018 - PATIENT HAD AN OFFICE VISIT SCHEDULED TO SEE DR. MIGUEL SAGARDIA ON Monday 08/20/2018 AT 9:20am. HE WAS A NO-SHOW. I TRIED TO CALL HIM TO RESCHEDULE BUT HAD TO LEAVE A VOICE MAIL TO RETURN OUR CALL. MBC

## 2019-02-20 ENCOUNTER — Other Ambulatory Visit: Payer: Self-pay

## 2019-02-20 ENCOUNTER — Encounter (HOSPITAL_COMMUNITY): Payer: Self-pay

## 2019-02-20 ENCOUNTER — Ambulatory Visit (HOSPITAL_COMMUNITY)
Admission: RE | Admit: 2019-02-20 | Discharge: 2019-02-20 | Disposition: A | Discharge status: Home / Self Care | Payer: BC Managed Care – PPO | Source: Ambulatory Visit | Attending: Family Medicine | Admitting: Family Medicine

## 2019-02-20 ENCOUNTER — Ambulatory Visit (HOSPITAL_COMMUNITY)
Admission: EM | Admit: 2019-02-20 | Discharge: 2019-02-20 | Disposition: A | Discharge status: Home / Self Care | Payer: BC Managed Care – PPO | Attending: Family Medicine | Admitting: Family Medicine

## 2019-02-20 DIAGNOSIS — M79604 Pain in right leg: Secondary | ICD-10-CM | POA: Diagnosis present

## 2019-02-20 DIAGNOSIS — M79661 Pain in right lower leg: Secondary | ICD-10-CM | POA: Diagnosis not present

## 2019-02-20 DIAGNOSIS — R52 Pain, unspecified: Secondary | ICD-10-CM

## 2019-02-20 NOTE — ED Triage Notes (Signed)
Patient presents to Urgent Care with complaints of right leg pain just below his knee since last night. Patient reports he can feel a lump there and the pain happens every 10 mins for several seconds at a time, pt has taken ibuprofen for the pain, no hx of blood clots.

## 2019-02-20 NOTE — ED Provider Notes (Signed)
[place]    CSN: [id number] Arrival date & time: 02/20/19  1005      History   Chief Complaint Chief Complaint  Patient presents with  . Leg Pain    HPI Joel [name]. is a 53 y.o. male.   [name]. presents with complaints of pain to right lateral/posterior proximal calf. Started last night. Comes and goes. Lasts a few seconds, pain 8/10, and then resolves. Occurs every 15 minutes. Tightness/squeezing sensation. He did work out yesterday, including stairs, leg extensions and squats. No specific known injury, however. Denies any previous similar. No history of blood clots, recent immobilization or travel. Doesn't smoke. No chest pain, cough or shortness of breath . No numbness or tingling to the leg. Has mild swelling, but no warmth, redness or bruising.     ROS per HPI, negative if not otherwise mentioned.      Past Medical History:  Diagnosis Date  . Anxiety   . Arthritis   . GERD (gastroesophageal reflux disease)     Patient Active Problem List   Diagnosis Date Noted  . Bilateral chronic knee pain 08/23/2013  . Asthma with allergic rhinitis 08/23/2013  . GERD (gastroesophageal reflux disease) 01/26/2012    No past surgical history on file.     Home Medications    Prior to Admission medications   Medication Sig Start Date End Date Taking? Authorizing Provider  albuterol (PROVENTIL HFA;VENTOLIN HFA) 108 (90 Base) MCG/ACT inhaler Inhale 1-2 puffs into the lungs every 6 (six) hours as needed for wheezing or shortness of breath. 05/01/17   Jaynee Eagles, PA-C  cetirizine (ZYRTEC ALLERGY) 10 MG tablet Take 1 tablet (10 mg total) by mouth daily. 05/01/17   Jaynee Eagles, PA-C  Multiple Vitamin (MULITIVITAMIN WITH MINERALS) TABS Take 1 tablet by mouth daily.    [provider]  Omega-3 Fatty Acids (FISH OIL) 1000 MG CAPS Take by mouth.    [provider]  pseudoephedrine (SUDAFED) 60 MG tablet Take 1 tablet (60 mg total) by mouth  every 8 (eight) hours as needed for congestion. 05/01/17   Jaynee Eagles, PA-C    Family History Family History  Problem Relation Age of Onset  . Cerebral aneurysm Mother   . Alzheimer's disease Maternal Grandmother     Social History Social History   Tobacco Use  . Smoking status: Never Smoker  . Smokeless tobacco: Never Used  Substance Use Topics  . Alcohol use: Yes    Alcohol/week: 3.0 standard drinks    Types: 3 Glasses of wine per week    Comment: social  . Drug use: No     Allergies   Patient has no known allergies.   Review of Systems Review of Systems   Physical Exam Triage Vital Signs ED Triage Vitals  Enc Vitals Group     BP 02/20/19 1037 129/77     Pulse Rate 02/20/19 1037 68     Resp 02/20/19 1037 16     Temp 02/20/19 1037 98.2 F (36.8 C)     Temp Source 02/20/19 1037 Oral     SpO2 02/20/19 1037 100 %     Weight --      Height --      Head Circumference --      Peak Flow --      Pain Score 02/20/19 1035 0     Pain Loc --      Pain Edu? --      Excl. in [place]? --  No data found.  Updated Vital Signs BP 129/77 (BP Location: Right Arm)   Pulse 68   Temp 98.2 F (36.8 C) (Oral)   Resp 16   SpO2 100%    Physical Exam Constitutional:      Appearance: He is well-developed.  Cardiovascular:     Rate and Rhythm: Normal rate.  Pulmonary:     Effort: Pulmonary effort is normal.  Musculoskeletal:       Legs:     Comments: Right proximal calf, lateral aspect with tenderness, mild swelling; pain with dorsiflexion; no redness or warmth; sensation to lower leg WNL  Skin:    General: Skin is warm and dry.  Neurological:     Mental Status: He is alert and oriented to person, place, and time.      UC Treatments / Results  Labs (all labs ordered are listed, but only abnormal results are displayed) Labs Reviewed - No data to display  EKG   Radiology No results found.  Procedures Procedures (including critical care time)  Medications  Ordered in UC Medications - No data to display  Initial Impression / Assessment and Plan / UC Course  I have reviewed the triage vital signs and the nursing notes.  Pertinent labs & imaging results that were available during my care of the patient were reviewed by me and considered in my medical decision making (see chart for details).     Right calf pain, new onset last night. No DVT risk factors, no tachycardia, hypoxia, chest pain. Worked out yesterday which included multiple leg exercises. dvt study to ensure negative for dvt ordered, scheduled for this afternoon. If negative likely strain vs partial tear. Pain management discussed. Follow up with sports medicine as needed. If positive for dvt blood thinner to be initiated. Patient verbalized understanding and agreeable to plan.  Ambulatory out of clinic without difficulty.    Final Clinical Impressions(s) / UC Diagnoses   Final diagnoses:  Right calf pain     Discharge Instructions     We will get an ultrasound scheduled to rule out blood clot. I would let you know if this is positive to start medications and you would then follow up closely with your primary care provider. Go to the ER for any chest pain, shortness of breath , numbness or tingling.   Still likely muscle strain/ partial tear related to activity yesterday. If negative ultrasound we will focus on healing and pain management with rest, light activity as tolerated, ice application, ibuprofen 800mg  every 8 hours as needed, take with food, follow up with sports medicine if persistent as needed.    Scheduled for dvt study at [facility] today at 3:00 pm.  Go to admitting office on arrival to [facility]    ED Prescriptions    None     PDMP not reviewed this encounter.   [facility], NP 02/20/19 1140

## 2019-02-20 NOTE — ED Notes (Signed)
[place] 3:00 pm for dvt study

## 2019-02-20 NOTE — Discharge Instructions (Signed)
We will get an ultrasound scheduled to rule out blood clot. I would let you know if this is positive to start medications and you would then follow up closely with your primary care provider. Go to the ER for any chest pain, shortness of breath , numbness or tingling.   Still likely muscle strain/ partial tear related to activity yesterday. If negative ultrasound we will focus on healing and pain management with rest, light activity as tolerated, ice application, ibuprofen 800mg  every 8 hours as needed, take with food, follow up with sports medicine if persistent as needed.    Scheduled for dvt study at [facility] today at 3:00 pm.  Go to admitting office on arrival to [facility]

## 2019-02-20 NOTE — Progress Notes (Signed)
Lower extremity venous has been completed.   Preliminary results in CV Proc.   Abram Sander 02/20/2019 3:27 PM

## 2019-09-12 ENCOUNTER — Other Ambulatory Visit: Payer: Self-pay

## 2019-09-12 ENCOUNTER — Ambulatory Visit (HOSPITAL_COMMUNITY)
Admission: EM | Admit: 2019-09-12 | Discharge: 2019-09-12 | Disposition: A | Discharge status: Home / Self Care | Payer: BC Managed Care – PPO

## 2019-09-12 ENCOUNTER — Encounter (HOSPITAL_COMMUNITY): Payer: Self-pay

## 2019-09-12 DIAGNOSIS — S6010XA Contusion of unspecified finger with damage to nail, initial encounter: Secondary | ICD-10-CM | POA: Diagnosis not present

## 2019-09-12 MED ORDER — CEPHALEXIN 500 MG PO CAPS: 500.0000 mg | ORAL_CAPSULE | Freq: Four times a day (QID) | ORAL | 0 refills | Status: DC

## 2019-09-12 NOTE — ED Provider Notes (Signed)
[place]    CSN: [id number] Arrival date & time: 09/12/19  1051      History   Chief Complaint Chief Complaint  Patient presents with  . Finger Injury    HPI Joel [name]. is a 54 y.o. male.   Patient is a 54 year old male presents today with pain, swelling and drainage from the right index finger around the nailbed.  He reports that he slammed his finger in a car door 2 weeks ago.  Felt like his symptoms were improving until about an hour ago started draining some dark fluid with foul smell.  No fevers, chills.  ROS per HPI      Past Medical History:  Diagnosis Date  . Anxiety   . Arthritis   . GERD (gastroesophageal reflux disease)     Patient Active Problem List   Diagnosis Date Noted  . Bilateral chronic knee pain 08/23/2013  . Asthma with allergic rhinitis 08/23/2013  . GERD (gastroesophageal reflux disease) 01/26/2012    History reviewed. No pertinent surgical history.     Home Medications    Prior to Admission medications   Medication Sig Start Date End Date Taking? Authorizing Provider  albuterol (PROVENTIL HFA;VENTOLIN HFA) 108 (90 Base) MCG/ACT inhaler Inhale 1-2 puffs into the lungs every 6 (six) hours as needed for wheezing or shortness of breath. 05/01/17   Jaynee Eagles, PA-C  cephALEXin (KEFLEX) 500 MG capsule Take 1 capsule (500 mg total) by mouth 4 (four) times daily. 09/12/19   Loura Halt A, NP  cetirizine (ZYRTEC ALLERGY) 10 MG tablet Take 1 tablet (10 mg total) by mouth daily. 05/01/17   Jaynee Eagles, PA-C  Multiple Vitamin (MULITIVITAMIN WITH MINERALS) TABS Take 1 tablet by mouth daily.    [provider]  Omega-3 Fatty Acids (FISH OIL) 1000 MG CAPS Take by mouth.    [provider]  pseudoephedrine (SUDAFED) 60 MG tablet Take 1 tablet (60 mg total) by mouth every 8 (eight) hours as needed for congestion. 05/01/17   Jaynee Eagles, PA-C  tadalafil (CIALIS) 20 MG tablet Take 20 mg by mouth daily as needed. 06/21/19    [provider]    Family History Family History  Problem Relation Age of Onset  . Cerebral aneurysm Mother   . Healthy Father   . Alzheimer's disease Maternal Grandmother     Social History Social History   Tobacco Use  . Smoking status: Never Smoker  . Smokeless tobacco: Never Used  Substance Use Topics  . Alcohol use: Yes    Alcohol/week: 3.0 standard drinks    Types: 3 Glasses of wine per week    Comment: social  . Drug use: No     Allergies   Patient has no known allergies.   Review of Systems Review of Systems   Physical Exam Triage Vital Signs ED Triage Vitals  Enc Vitals Group     BP 09/12/19 1148 (!) 138/94     Pulse Rate 09/12/19 1148 79     Resp 09/12/19 1148 19     Temp 09/12/19 1148 98.3 F (36.8 C)     Temp Source 09/12/19 1148 Oral     SpO2 09/12/19 1148 99 %     Weight 09/12/19 1147 215 lb (97.5 kg)     Height --      Head Circumference --      Peak Flow --      Pain Score 09/12/19 1146 3     Pain Loc --  Pain Edu? --      Excl. in [place]? --    No data found.  Updated Vital Signs BP (!) 138/94 (BP Location: Right Arm)   Pulse 79   Temp 98.3 F (36.8 C) (Oral)   Resp 19   Wt 215 lb (97.5 kg)   SpO2 99%   BMI 30.41 kg/m   Visual Acuity Right Eye Distance:   Left Eye Distance:   Bilateral Distance:    Right Eye Near:   Left Eye Near:    Bilateral Near:     Physical Exam Vitals and nursing note reviewed.  Constitutional:      Appearance: Normal appearance.  HENT:     Head: Normocephalic and atraumatic.     Nose: Nose normal.  Eyes:     Conjunctiva/sclera: Conjunctivae normal.  Pulmonary:     Effort: Pulmonary effort is normal.  Musculoskeletal:        General: Normal range of motion.     Cervical back: Normal range of motion.     Comments: Subungual hematoma to right index finger Dried blood to outside of nail Tender around nailbed  Skin:    General: Skin is warm and dry.  Neurological:     Mental  Status: He is alert.  Psychiatric:        Mood and Affect: Mood normal.      UC Treatments / Results  Labs (all labs ordered are listed, but only abnormal results are displayed) Labs Reviewed - No data to display  EKG   Radiology No results found.  Procedures Procedures (including critical care time)  Medications Ordered in UC Medications - No data to display  Initial Impression / Assessment and Plan / UC Course  I have reviewed the triage vital signs and the nursing notes.  Pertinent labs & imaging results that were available during my care of the patient were reviewed by me and considered in my medical decision making (see chart for details).     Subungual hematoma of the right index finger. It appears that the finger has drained.  Most likely this is old blood from underneath the nail.  Based on drainage and foul smell and mild tenderness around nail bed we will go ahead and cover for infection with Keflex. Recommend warm soaks to the area. Follow up as needed for continued or worsening symptoms  Final Clinical Impressions(s) / UC Diagnoses   Final diagnoses:  Subungual hematoma of digit of hand, initial encounter     Discharge Instructions     We will go ahead and cover with antibiotics based on the pain and discharge from the finger. Take antibiotics as prescribed. You can take ibuprofen for pain as needed Warm soaks to the finger Follow up as needed for continued or worsening symptoms     ED Prescriptions    Medication Sig Dispense Auth. Provider   cephALEXin (KEFLEX) 500 MG capsule Take 1 capsule (500 mg total) by mouth 4 (four) times daily. 28 capsule Kaniah Rizzolo A, NP     PDMP not reviewed this encounter.   Dahlia Byes A, NP 09/12/19 1220

## 2019-09-12 NOTE — Discharge Instructions (Signed)
We will go ahead and cover with antibiotics based on the pain and discharge from the finger. Take antibiotics as prescribed. You can take ibuprofen for pain as needed Warm soaks to the finger Follow up as needed for continued or worsening symptoms

## 2019-09-12 NOTE — ED Triage Notes (Signed)
Pt is here with right index finger(Finger #2) after slamming it in the car door a few weeks ago. States it was leaking liquid an hour and wanted it to be checked out, pt has not taken anything to relieve discomfort.

## 2020-06-17 ENCOUNTER — Telehealth: Payer: Self-pay | Admitting: Family

## 2020-06-19 ENCOUNTER — Telehealth (INDEPENDENT_AMBULATORY_CARE_PROVIDER_SITE_OTHER): Payer: Self-pay | Admitting: Family

## 2020-06-19 ENCOUNTER — Encounter: Payer: Self-pay | Admitting: Family

## 2020-06-19 DIAGNOSIS — Z7689 Persons encountering health services in other specified circumstances: Secondary | ICD-10-CM

## 2020-06-19 NOTE — Progress Notes (Signed)
Establish care Needs physical  

## 2020-06-19 NOTE — Progress Notes (Signed)
Virtual Visit via Telephone Note  I connected with Joel Harness., on 06/19/2020 at 3:33 PM by telephone due to the COVID-19 pandemic and verified that I am speaking with the correct person using two identifiers.  Due to current restrictions/limitations of in-office visits due to the COVID-19 pandemic, this scheduled clinical appointment was converted to a telehealth visit.   Consent: I discussed the limitations, risks, security and privacy concerns of performing an evaluation and management service by telephone and the availability of in person appointments. I also discussed with the patient that there may be a patient responsible charge related to this service. The patient expressed understanding and agreed to proceed.   Location of Patient: Home  Location of Provider: [facility]   Persons participating in Telemedicine visit: Taber Sweetser. Ricky Stabs, NP Margorie John, CMA  History of Present Illness: Joel [name] is a 55 year-old male who presents to establish care. PMH significant for asthma with allergic rhinitis, gastroesophageal reflux disease, and bilateral chronic knee pain.  Current issues and/or concerns: None   Past Medical History:  Diagnosis Date  . Anxiety   . Arthritis   . GERD (gastroesophageal reflux disease)    No Known Allergies  Current Outpatient Medications on File Prior to Visit  Medication Sig Dispense Refill  . albuterol (PROVENTIL HFA;VENTOLIN HFA) 108 (90 Base) MCG/ACT inhaler Inhale 1-2 puffs into the lungs every 6 (six) hours as needed for wheezing or shortness of breath. 1 Inhaler 0  . cephALEXin (KEFLEX) 500 MG capsule Take 1 capsule (500 mg total) by mouth 4 (four) times daily. 28 capsule 0  . cetirizine (ZYRTEC ALLERGY) 10 MG tablet Take 1 tablet (10 mg total) by mouth daily. 30 tablet 11  . Multiple Vitamin (MULITIVITAMIN WITH MINERALS) TABS Take 1 tablet by mouth daily.    . Omega-3 Fatty Acids  (FISH OIL) 1000 MG CAPS Take by mouth.    . pseudoephedrine (SUDAFED) 60 MG tablet Take 1 tablet (60 mg total) by mouth every 8 (eight) hours as needed for congestion. 30 tablet 0  . tadalafil (CIALIS) 20 MG tablet Take 20 mg by mouth daily as needed.     No current facility-administered medications on file prior to visit.    Observations/Objective: Alert and oriented x 3. Not in acute distress. Physical examination not completed as this is a telemedicine visit.  Assessment and Plan: 1. Encounter to establish care: - Patient presents today to establish care.  - Return for annual physical examination, labs, and health maintenance. Arrive fasting meaning having had no food and/or nothing to drink for at least 8 hours prior to appointment.  Follow Up Instructions: Return for annual physical exam.   Patient was given clear instructions to go to Emergency Department or return to medical center if symptoms don't improve, worsen, or new problems develop.The patient verbalized understanding.  I discussed the assessment and treatment plan with the patient. The patient was provided an opportunity to ask questions and all were answered. The patient agreed with the plan and demonstrated an understanding of the instructions.   The patient was advised to call back or seek an in-person evaluation if the symptoms worsen or if the condition fails to improve as anticipated.   I provided 10 minutes total of non-face-to-face time during this encounter including median intraservice time, reviewing previous notes, labs, imaging, medications, management and patient verbalized understanding.    Yanci Bachtell Jodi Geralds, NP  [facility] at  [address] [place], [place] [phone] 06/19/2020, 3:33 PM

## 2020-07-15 ENCOUNTER — Other Ambulatory Visit: Payer: Self-pay

## 2020-07-15 ENCOUNTER — Ambulatory Visit (INDEPENDENT_AMBULATORY_CARE_PROVIDER_SITE_OTHER): Payer: BC Managed Care – PPO | Admitting: Family

## 2020-07-15 ENCOUNTER — Encounter: Payer: Self-pay | Admitting: Family

## 2020-07-15 VITALS — BP 131/86 | HR 66 | Ht 71.46 in | Wt 220.8 lb

## 2020-07-15 DIAGNOSIS — Z113 Encounter for screening for infections with a predominantly sexual mode of transmission: Secondary | ICD-10-CM

## 2020-07-15 DIAGNOSIS — Z13220 Encounter for screening for lipoid disorders: Secondary | ICD-10-CM

## 2020-07-15 DIAGNOSIS — Z0000 Encounter for general adult medical examination without abnormal findings: Secondary | ICD-10-CM | POA: Diagnosis not present

## 2020-07-15 DIAGNOSIS — Z114 Encounter for screening for human immunodeficiency virus [HIV]: Secondary | ICD-10-CM

## 2020-07-15 DIAGNOSIS — Z131 Encounter for screening for diabetes mellitus: Secondary | ICD-10-CM | POA: Diagnosis not present

## 2020-07-15 DIAGNOSIS — Z130 Encounter for screening for diseases of the blood and blood-forming organs and certain disorders involving the immune mechanism: Secondary | ICD-10-CM | POA: Diagnosis not present

## 2020-07-15 DIAGNOSIS — Z1211 Encounter for screening for malignant neoplasm of colon: Secondary | ICD-10-CM

## 2020-07-15 DIAGNOSIS — Z13228 Encounter for screening for other metabolic disorders: Secondary | ICD-10-CM | POA: Diagnosis not present

## 2020-07-15 DIAGNOSIS — Z1329 Encounter for screening for other suspected endocrine disorder: Secondary | ICD-10-CM

## 2020-07-15 NOTE — Progress Notes (Signed)
Physical

## 2020-07-15 NOTE — Patient Instructions (Signed)
 Annual physical exam and labs today.   Preventive Care 40-53 Years Old, Male Old, Male Preventive care refers to lifestyle choices and visits with your health care provider that can promote health and wellness. This includes:  A yearly physical exam. This is also called an annual wellness visit.  Regular dental and eye exams.  Immunizations.  Screening for certain conditions.  Healthy lifestyle choices, such as: ? Eating a healthy diet. ? Getting regular exercise. ? Not using drugs or products that contain nicotine and tobacco. ? Limiting alcohol use. What can I expect for my preventive care visit? Physical exam Your health care provider will check your:  Height and weight. These may be used to calculate your BMI (body mass index). BMI is a measurement that tells if you are at a healthy weight.  Heart rate and blood pressure.  Body temperature.  Skin for abnormal spots. Counseling Your health care provider may ask you questions about your:  Past medical problems.  Family's medical history.  Alcohol, tobacco, and drug use.  Emotional well-being.  Home life and relationship well-being.  Sexual activity.  Diet, exercise, and sleep habits.  Work and work environment.  Access to firearms. What immunizations do I need? Vaccines are usually given at various ages, according to a schedule. Your health care provider will recommend vaccines for you based on your age, medical history, and lifestyle or other factors, such as travel or where you work.   What tests do I need? Blood tests  Lipid and cholesterol levels. These may be checked every 5 years, or more often if you are over 53 years old.  Hepatitis C test.  Hepatitis B test. Screening  Lung cancer screening. You may have this screening every year starting at age 53 if you have a 30-pack-year history of smoking and currently smoke or have quit within the past 15 years.  Prostate cancer screening. Recommendations will  vary depending on your family history and other risks.  Genital exam to check for testicular cancer or hernias.  Colorectal cancer screening. ? All adults should have this screening starting at age 53 and continuing until age 75. ? Your health care provider may recommend screening at age 53 if you are at increased risk. ? You will have tests every 1-10 years, depending on your results and the type of screening test.  Diabetes screening. ? This is done by checking your blood sugar (glucose) after you have not eaten for a while (fasting). ? You may have this done every 1-3 years.  STD (sexually transmitted disease) testing, if you are at risk. Follow these instructions at home: Eating and drinking  Eat a diet that includes fresh fruits and vegetables, whole grains, lean protein, and low-fat dairy products.  Take vitamin and mineral supplements as recommended by your health care provider.  Do not drink alcohol if your health care provider tells you not to drink.  If you drink alcohol: ? Limit how much you have to 0-2 drinks a day. ? Be aware of how much alcohol is in your drink. In the [place] one drink equals one 12 oz bottle of beer (355 mL), one 5 oz glass of wine (148 mL), or one 1 oz glass of hard liquor (44 mL).   Lifestyle  Take daily care of your teeth and gums. Brush your teeth every morning and night with fluoride toothpaste. Floss one time each day.  Stay active. Exercise for at least 30 minutes 5 or more days each week.    Do not use any products that contain nicotine or tobacco, such as cigarettes, e-cigarettes, and chewing tobacco. If you need help quitting, ask your health care provider.  Do not use drugs.  If you are sexually active, practice safe sex. Use a condom or other form of protection to prevent STIs (sexually transmitted infections).  If told by your health care provider, take low-dose aspirin daily starting at age 53.  Find healthy ways to cope with stress,  such as: ? Meditation, yoga, or listening to music. ? Journaling. ? Talking to a trusted person. ? Spending time with friends and family. Safety  Always wear your seat belt while driving or riding in a vehicle.  Do not drive: ? If you have been drinking alcohol. Do not ride with someone who has been drinking. ? When you are tired or distracted. ? While texting.  Wear a helmet and other protective equipment during sports activities.  If you have firearms in your house, make sure you follow all gun safety procedures. What's next?  Go to your health care provider once a year for an annual wellness visit.  Ask your health care provider how often you should have your eyes and teeth checked.  Stay up to date on all vaccines. This information is not intended to replace advice given to you by your health care provider. Make sure you discuss any questions you have with your health care provider. Document Revised: 01/08/2019 Document Reviewed: 04/05/2018 Elsevier Patient Education  2021 Elsevier Inc.  

## 2020-07-15 NOTE — Progress Notes (Signed)
Patient ID: Joel [name]., male    DOB: 1966-04-06  MRN: [id number]  CC: Annual Physical Exam  Subjective: Tremell Reimers is a 55 y.o. male who presents for annual physical exam. His concerns today include: none  Patient Active Problem List   Diagnosis Date Noted  . Bilateral chronic knee pain 08/23/2013  . Asthma with allergic rhinitis 08/23/2013  . GERD (gastroesophageal reflux disease) 01/26/2012     No current outpatient medications on file prior to visit.   No current facility-administered medications on file prior to visit.    No Known Allergies  Social History   Socioeconomic History  . Marital status: Married    Spouse name: Not on file  . Number of children: Not on file  . Years of education: Not on file  . Highest education level: Not on file  Occupational History  . Occupation: Research scientist (medical)  Tobacco Use  . Smoking status: Never Smoker  . Smokeless tobacco: Never Used  Vaping Use  . Vaping Use: Never used  Substance and Sexual Activity  . Alcohol use: Yes    Alcohol/week: 3.0 standard drinks    Types: 3 Glasses of wine per week    Comment: social  . Drug use: No  . Sexual activity: Yes    Birth control/protection: None    Comment: number of sex partners in the last 12 months  1  Other Topics Concern  . Not on file  Social History Narrative  . Not on file   Social Determinants of Health   Financial Resource Strain: Not on file  Food Insecurity: Not on file  Transportation Needs: Not on file  Physical Activity: Not on file  Stress: Not on file  Social Connections: Not on file  Intimate Partner Violence: Not on file    Family History  Problem Relation Age of Onset  . Cerebral aneurysm Mother   . Healthy Father   . Alzheimer's disease Maternal Grandmother     No past surgical history on file.  ROS: Review of Systems Negative except as stated above  PHYSICAL EXAM: BP 131/86 (BP Location: Left Arm, Patient Position: Sitting)   Pulse  66   Ht 5' 11.46" (1.815 m)   Wt 220 lb 12.8 oz (100.2 kg)   SpO2 98%   BMI 30.40 kg/m   Physical Exam Constitutional:      Appearance: He is obese.  HENT:     Head: Normocephalic and atraumatic.     Right Ear: Tympanic membrane, ear canal and external ear normal.     Left Ear: Tympanic membrane, ear canal and external ear normal.     Nose: Nose normal.     Mouth/Throat:     Mouth: Mucous membranes are moist.     Pharynx: Oropharynx is clear.  Eyes:     Extraocular Movements: Extraocular movements intact.     Conjunctiva/sclera: Conjunctivae normal.     Pupils: Pupils are equal, round, and reactive to light.  Cardiovascular:     Rate and Rhythm: Normal rate and regular rhythm.     Pulses: Normal pulses.     Heart sounds: Normal heart sounds.  Pulmonary:     Effort: Pulmonary effort is normal.     Breath sounds: Normal breath sounds.  Abdominal:     General: Bowel sounds are normal.     Palpations: Abdomen is soft.  Genitourinary:    Comments: Patient declined examination.  Musculoskeletal:        General: Normal range  of motion.     Cervical back: Normal range of motion and neck supple.  Skin:    General: Skin is warm and dry.     Capillary Refill: Capillary refill takes less than 2 seconds.  Neurological:     General: No focal deficit present.     Mental Status: He is alert and oriented to person, place, and time.  Psychiatric:        Mood and Affect: Mood normal.        Behavior: Behavior normal.     ASSESSMENT AND PLAN: 1. Annual physical exam: - Counseled on 150 minutes of exercise per week as tolerated, healthy eating (including decreased daily intake of saturated fats, cholesterol, added sugars, sodium), STI prevention, and routine healthcare maintenance.  2. Screening for metabolic disorder: - CMP to check kidney function, liver function, and electrolyte balance.  - Comprehensive metabolic panel  3. Screening for deficiency anemia: - CBC to screen for  anemia. - CBC  4. Diabetes mellitus screening: - Hemoglobin A1c to screen for pre-diabetes/diabetes. - Hemoglobin A1c  5. Screening cholesterol level: - Lipid panel to screen for high cholesterol.  - Lipid panel  6. Thyroid disorder screen: - TSH to check thyroid function.  - TSH+T4F+T3Free  7. Colon cancer screening: - Referral to Gastroenterology for colon cancer screening by colonoscopy. - Ambulatory referral to Gastroenterology  8. Encounter for screening for HIV: - HIV antibody to screen for human immunodeficiency virus.  - HIV antibody (with reflex)  9. Routine screening for STI (sexually transmitted infection): - Urine cytology to screen for chlamydia, gonorrhea, and trichomonas.  - Urine cytology ancillary only   Patient was given the opportunity to ask questions.  Patient verbalized understanding of the plan and was able to repeat key elements of the plan. Patient was given clear instructions to go to Emergency Department or return to medical center if symptoms don't improve, worsen, or new problems develop.The patient verbalized understanding.   Orders Placed This Encounter  Procedures  . CBC  . Lipid panel  . TSH+T4F+T3Free  . Hemoglobin A1c  . Comprehensive metabolic panel  . HIV antibody (with reflex)  . Ambulatory referral to Gastroenterology     Requested Prescriptions    No prescriptions requested or ordered in this encounter    Follow-up with primary provider as scheduled.   Rema Fendt, NP

## 2020-07-16 ENCOUNTER — Other Ambulatory Visit (HOSPITAL_COMMUNITY)
Admission: RE | Admit: 2020-07-16 | Discharge: 2020-07-16 | Disposition: A | Discharge status: Home / Self Care | Payer: BC Managed Care – PPO | Source: Ambulatory Visit | Attending: Family | Admitting: Family

## 2020-07-16 DIAGNOSIS — Z113 Encounter for screening for infections with a predominantly sexual mode of transmission: Secondary | ICD-10-CM | POA: Diagnosis present

## 2020-07-16 LAB — CBC
Hematocrit: 42.9 % (ref 37.5–51.0)
Hemoglobin: 14.2 g/dL (ref 13.0–17.7)
MCH: 30.9 pg (ref 26.6–33.0)
MCHC: 33.1 g/dL (ref 31.5–35.7)
MCV: 94 fL (ref 79–97)
Platelets: 292 10*3/uL (ref 150–450)
RBC: 4.59 x10E6/uL (ref 4.14–5.80)
RDW: 11.8 % (ref 11.6–15.4)
WBC: 6.6 10*3/uL (ref 3.4–10.8)

## 2020-07-16 LAB — HIV ANTIBODY (ROUTINE TESTING W REFLEX): HIV Screen 4th Generation wRfx: NONREACTIVE

## 2020-07-16 LAB — COMPREHENSIVE METABOLIC PANEL
ALT: 16 IU/L (ref 0–44)
AST: 22 IU/L (ref 0–40)
Albumin/Globulin Ratio: 1.8 (ref 1.2–2.2)
Albumin: 5 g/dL — ABNORMAL HIGH (ref 3.8–4.9)
Alkaline Phosphatase: 50 IU/L (ref 44–121)
BUN/Creatinine Ratio: 14 (ref 9–20)
BUN: 17 mg/dL (ref 6–24)
Bilirubin Total: 0.6 mg/dL (ref 0.0–1.2)
CO2: 23 mmol/L (ref 20–29)
Calcium: 10.1 mg/dL (ref 8.7–10.2)
Chloride: 104 mmol/L (ref 96–106)
Creatinine, Ser: 1.25 mg/dL (ref 0.76–1.27)
Globulin, Total: 2.8 g/dL (ref 1.5–4.5)
Glucose: 97 mg/dL (ref 65–99)
Potassium: 4.3 mmol/L (ref 3.5–5.2)
Sodium: 142 mmol/L (ref 134–144)
Total Protein: 7.8 g/dL (ref 6.0–8.5)
eGFR: 69 mL/min/{1.73_m2} (ref >59)

## 2020-07-16 LAB — LIPID PANEL
Chol/HDL Ratio: 5.2 ratio — ABNORMAL HIGH (ref 0.0–5.0)
Cholesterol, Total: 220 mg/dL — ABNORMAL HIGH (ref 100–199)
HDL: 42 mg/dL (ref >39)
LDL Chol Calc (NIH): 152 mg/dL — ABNORMAL HIGH (ref 0–99)
Triglycerides: 143 mg/dL (ref 0–149)
VLDL Cholesterol Cal: 26 mg/dL (ref 5–40)

## 2020-07-16 LAB — HEMOGLOBIN A1C
Est. average glucose Bld gHb Est-mCnc: 100 mg/dL
Hgb A1c MFr Bld: 5.1 % (ref 4.8–5.6)

## 2020-07-16 LAB — TSH+T4F+T3FREE
Free T4: 1.24 ng/dL (ref 0.82–1.77)
T3, Free: 3 pg/mL (ref 2.0–4.4)
TSH: 0.643 u[IU]/mL (ref 0.450–4.500)

## 2020-07-16 NOTE — Progress Notes (Signed)
Kidney function normal.   Liver function normal.   Thyroid normal.   No diabetes.  No anemia.   HIV negative.   Cholesterol higher than expected. High cholesterol may increase risk of heart attack and/or stroke. Consider eating more fruits, vegetables, and lean baked meats such as chicken or fish. Moderate intensity exercise at least 150 minutes as tolerated per week may help as well. Patient encouraged to have rechecked in 6 months or sooner if needed.   There are some minor variations in your blood work that do not require any additional work up at this time.

## 2020-07-17 LAB — URINE CYTOLOGY ANCILLARY ONLY
Chlamydia: NEGATIVE
Comment: NEGATIVE
Comment: NEGATIVE
Comment: NORMAL
Neisseria Gonorrhea: NEGATIVE
Trichomonas: NEGATIVE

## 2020-07-18 NOTE — Progress Notes (Signed)
Chlamydia, gonorrhea, and trichomonas negative.

## 2020-10-27 NOTE — Progress Notes (Signed)
Patient ID: Joel [name]., male    DOB: July 05, 1965  MRN: [id number]  CC: RIGHT THIGH LUMP  Subjective: Joel [name] is a 55 y.o. male who presents for right thigh lump.    His concerns today include:   RIGHT THIGH LUMP: Duration: months Location: right thigh Onset: sudden noticed one day when leaning forward on hand with elbow pressing into thigh Painful: sometimes tender and sometimes painful 2-5/10 Discomfort: yes Status:  not changing Trauma: no Redness: no Bruising: no History of the same: no Patient requesting ultrasound stating this is more affordable than other imaging.   Patient Active Problem List   Diagnosis Date Noted   Bilateral chronic knee pain 08/23/2013   Asthma with allergic rhinitis 08/23/2013   GERD (gastroesophageal reflux disease) 01/26/2012     Current Outpatient Medications on File Prior to Visit  Medication Sig Dispense Refill   meloxicam (MOBIC) 15 MG tablet Take 15 mg by mouth daily.     No current facility-administered medications on file prior to visit.    No Known Allergies  Social History   Socioeconomic History   Marital status: Married    Spouse name: Not on file   Number of children: Not on file   Years of education: Not on file   Highest education level: Not on file  Occupational History   Occupation: Research scientist (medical)  Tobacco Use   Smoking status: Never   Smokeless tobacco: Never  Vaping Use   Vaping Use: Never used  Substance and Sexual Activity   Alcohol use: Yes    Alcohol/week: 3.0 standard drinks    Types: 3 Glasses of wine per week    Comment: social   Drug use: No   Sexual activity: Yes    Birth control/protection: None    Comment: number of sex partners in the last 12 months  1  Other Topics Concern   Not on file  Social History Narrative   Not on file   Social Determinants of Health   Financial Resource Strain: Not on file  Food Insecurity: Not on file  Transportation Needs: Not on file  Physical  Activity: Not on file  Stress: Not on file  Social Connections: Not on file  Intimate Partner Violence: Not on file    Family History  Problem Relation Age of Onset   Cerebral aneurysm Mother    Healthy Father    Alzheimer's disease Maternal Grandmother     History reviewed. No pertinent surgical history.  ROS: Review of Systems Negative except as stated above  PHYSICAL EXAM: BP 126/84 (BP Location: Left Arm, Patient Position: Sitting, Cuff Size: Normal)   Pulse 65   Temp 98.1 F (36.7 C)   Resp 16   Wt 212 lb 3.2 oz (96.3 kg)   SpO2 98%   BMI 29.22 kg/m   Physical Exam HENT:     Head: Normocephalic and atraumatic.  Eyes:     Extraocular Movements: Extraocular movements intact.     Conjunctiva/sclera: Conjunctivae normal.     Pupils: Pupils are equal, round, and reactive to light.  Cardiovascular:     Rate and Rhythm: Normal rate and regular rhythm.     Pulses: Normal pulses.     Heart sounds: Normal heart sounds.  Musculoskeletal:     Cervical back: Normal range of motion and neck supple.     Comments: Walnut-sized firm movable tender nodule located in deep tissue of right anterior aspect thigh. No evidence of edema or erythema.  Neurological:     General: No focal deficit present.     Mental Status: He is alert and oriented to person, place, and time.  Psychiatric:        Mood and Affect: Mood normal.        Behavior: Behavior normal.    ASSESSMENT AND PLAN: 1. Lump of right thigh: - Counseled patient that an MRI may be more appropriate for evaluation of current concern.  - Patient prefers ultrasound related to financial costs of other imaging being more expensive. - Ultrasound soft tissue lower extremity for further evaluation and management.  - Follow-up with primary provider as scheduled.  - US SOFT TISSUE LOWER EXTREMITY LIMITED RIGHT (NON-VASCULAR); Future   Patient was given the opportunity to ask questions.  Patient verbalized understanding of the  plan and was able to repeat key elements of the plan. Patient was given clear instructions to go to Emergency Department or return to medical center if symptoms don't improve, worsen, or new problems develop.The patient verbalized understanding.   Orders Placed This Encounter  Procedures   US SOFT TISSUE LOWER EXTREMITY LIMITED RIGHT (NON-VASCULAR)     Devun Anna Jodi Geralds, NP

## 2020-10-28 ENCOUNTER — Encounter: Payer: Self-pay | Admitting: Family

## 2020-10-28 ENCOUNTER — Ambulatory Visit: Payer: BC Managed Care – PPO | Admitting: Family

## 2020-10-28 ENCOUNTER — Other Ambulatory Visit: Payer: Self-pay

## 2020-10-28 VITALS — BP 126/84 | HR 65 | Temp 98.1°F | Resp 16 | Wt 212.2 lb

## 2020-10-28 DIAGNOSIS — R2241 Localized swelling, mass and lump, right lower limb: Secondary | ICD-10-CM | POA: Diagnosis not present

## 2020-12-01 ENCOUNTER — Other Ambulatory Visit: Payer: Self-pay

## 2020-12-01 ENCOUNTER — Ambulatory Visit (HOSPITAL_COMMUNITY)
Admission: RE | Admit: 2020-12-01 | Discharge: 2020-12-01 | Disposition: A | Discharge status: Home / Self Care | Payer: BC Managed Care – PPO | Source: Ambulatory Visit | Attending: Family | Admitting: Family

## 2020-12-01 DIAGNOSIS — R2241 Localized swelling, mass and lump, right lower limb: Secondary | ICD-10-CM | POA: Diagnosis present

## 2021-03-16 ENCOUNTER — Telehealth: Payer: Self-pay | Admitting: Family

## 2021-03-16 NOTE — Telephone Encounter (Signed)
Pt states he is now available to have colonoscopy done and is asking for information regarding the referral PCP sent for this referral. Pt states he needs this done asap since he's over 50. Please advise and thank you.

## 2021-03-19 ENCOUNTER — Other Ambulatory Visit: Payer: Self-pay | Admitting: Family

## 2021-03-19 DIAGNOSIS — Z1211 Encounter for screening for malignant neoplasm of colon: Secondary | ICD-10-CM

## 2021-03-19 NOTE — Telephone Encounter (Signed)
New order for colonoscopy placed. Their office should call patient within 2 weeks with appointment details.

## 2021-08-01 ENCOUNTER — Emergency Department (HOSPITAL_COMMUNITY)
Admission: EM | Admit: 2021-08-01 | Discharge: 2021-08-01 | Disposition: A | Discharge status: Home / Self Care | Payer: BC Managed Care – PPO | Attending: Emergency Medicine | Admitting: Emergency Medicine

## 2021-08-01 ENCOUNTER — Other Ambulatory Visit: Payer: Self-pay

## 2021-08-01 DIAGNOSIS — K644 Residual hemorrhoidal skin tags: Secondary | ICD-10-CM | POA: Insufficient documentation

## 2021-08-01 DIAGNOSIS — K625 Hemorrhage of anus and rectum: Secondary | ICD-10-CM

## 2021-08-01 DIAGNOSIS — K649 Unspecified hemorrhoids: Secondary | ICD-10-CM

## 2021-08-01 MED ORDER — AMOXICILLIN-POT CLAVULANATE 875-125 MG PO TABS: 1.0000 | ORAL_TABLET | Freq: Two times a day (BID) | ORAL | 0 refills | Status: AC

## 2021-08-01 NOTE — Discharge Instructions (Addendum)
If you begin having fevers at home in the next several days, or worsening abdominal pain, specifically in the left lower side, or pain with nausea, you should start taking the antibiotic, which is a prescription for diverticulitis or inflammation of your bowels. ?

## 2021-08-01 NOTE — ED Notes (Signed)
Discharge instructions reviewed, questions answered. Rx education provided. Pt advised to follow up with PCP and return for worsening symptoms. Pt states understanding and no further questions. Pt ambulatory with steady gait upon discharge. No s/s of distress noted. ? ?

## 2021-08-01 NOTE — ED Triage Notes (Signed)
Pt from home.  Reports constipation and hemorrhoids that developed last week. Today was not constipated but had 3 regular bowel movements and reported bloody tissue after each one. No bleeding between bowel movements. Also endorses some feeling of nausea and "soreness" to lower inguinal area.   ?

## 2021-08-01 NOTE — ED Provider Notes (Signed)
?[facility]-EMERGENCY DEPT ?Provider Note ? ? ?CSN: [id number] ?Arrival date & time: 08/01/21  2055 ? ?  ? ?History ? ?Chief Complaint  ?Patient presents with  ? Rectal Bleeding  ? ? ?Joel [name]. is a 56 y.o. male presented emergency department with rectal bleeding.  The patient reports that he has been constipated for the past 2 days, was straining on the toilet, began having blood with his bowel movements, 3 bowel movements today with blood with wiping.  He does have a history of hemorrhoids.  He put ointment on the hemorrhoids.  He is not on blood thinners.  He has never had a colonoscopy but has 1 pending through his PCPs office in the next few months.  He reports that after his bowel movements he began to have some cramping lower abdominal pain, said he felt like "I was coming down with the flu or something".  He now feels better in the ED ? ?HPI ? ?  ? ?Home Medications ?Prior to Admission medications   ?Medication Sig Start Date End Date Taking? Authorizing Provider  ?amoxicillin-clavulanate (AUGMENTIN) 875-125 MG tablet Take 1 tablet by mouth 2 (two) times daily for 7 days. 08/01/21 08/08/21 Yes Panfilo Ketchum, Kermit Balo, MD  ?meloxicam (MOBIC) 15 MG tablet Take 15 mg by mouth daily. 08/24/20   [provider]  ?   ? ?Allergies    ?Patient has no known allergies.   ? ?Review of Systems   ?Review of Systems ? ?Physical Exam ?Updated Vital Signs ?BP (!) 173/106 (BP Location: Left Arm)   Pulse 79   Temp 98.2 ?F (36.8 ?C) (Oral)   Resp 18   Ht 5\' 11"  (1.803 m)   Wt 97.5 kg   SpO2 97%   BMI 29.99 kg/m?  ?Physical Exam ?Constitutional:   ?   General: He is not in acute distress. ?HENT:  ?   Head: Normocephalic and atraumatic.  ?Eyes:  ?   Conjunctiva/sclera: Conjunctivae normal.  ?   Pupils: Pupils are equal, round, and reactive to light.  ?Cardiovascular:  ?   Rate and Rhythm: Normal rate and regular rhythm.  ?Pulmonary:  ?   Effort: Pulmonary effort is normal. No respiratory distress.   ?Genitourinary: ?   Comments: Nonthrombosed, tender external hemorrhoid, mild bleeding on exam ?Skin: ?   General: Skin is warm and dry.  ?Neurological:  ?   General: No focal deficit present.  ?   Mental Status: He is alert. Mental status is at baseline.  ?Psychiatric:     ?   Mood and Affect: Mood normal.     ?   Behavior: Behavior normal.  ? ? ?ED Results / Procedures / Treatments   ?Labs ?(all labs ordered are listed, but only abnormal results are displayed) ?Labs Reviewed - No data to display ? ?EKG ?None ? ?Radiology ?No results found. ? ?Procedures ?Procedures  ? ? ?Medications Ordered in ED ?Medications - No data to display ? ?ED Course/ Medical Decision Making/ A&P ?  ?                        ?Medical Decision Making ? ?Differential diagnosis for rectal bleeding would include bleeding hemorrhoids versus diverticulosis versus diverticulitis versus other. ? ?Patient is well-appearing clinically on exam, normal heart rate, no evidence of acute blood loss anemia. ? ?I do not believe that he needs blood work or emergent CT imaging of his abdomen at this time. ? ?He  does not have any focal abdominal tenderness on exam, nor does he appear septic, but with his symptoms he described I think is reasonable to provide a prescription for Augmentin at home for possible diverticulitis coverage, explained that he begins having worsening abdominal pain, or fevers, or nausea and loss of appetite, or persistent bloody stools, he can start this prescription at home.  He verbalized understanding.  He is stable for discharge ? ? ? ? ? ? ? ?Final Clinical Impression(s) / ED Diagnoses ?Final diagnoses:  ?Rectal bleeding  ?Hemorrhoids, unspecified hemorrhoid type  ? ? ?Rx / DC Orders ?ED Discharge Orders   ? ?      Ordered  ?  amoxicillin-clavulanate (AUGMENTIN) 875-125 MG tablet  2 times daily       ? 08/01/21 2140  ? ?  ?  ? ?  ? ? ?  ?Terald Sleeper, MD ?08/01/21 2140 ? ?

## 2021-09-23 ENCOUNTER — Encounter: Payer: Self-pay | Admitting: Physician Assistant

## 2021-09-23 ENCOUNTER — Ambulatory Visit (INDEPENDENT_AMBULATORY_CARE_PROVIDER_SITE_OTHER): Payer: Self-pay | Admitting: Physician Assistant

## 2021-09-23 ENCOUNTER — Telehealth: Payer: Self-pay | Admitting: Family

## 2021-09-23 VITALS — BP 134/84 | HR 68 | Temp 98.0°F | Resp 18 | Ht 70.0 in | Wt 208.0 lb

## 2021-09-23 DIAGNOSIS — Z13220 Encounter for screening for lipoid disorders: Secondary | ICD-10-CM

## 2021-09-23 DIAGNOSIS — R599 Enlarged lymph nodes, unspecified: Secondary | ICD-10-CM | POA: Insufficient documentation

## 2021-09-23 DIAGNOSIS — Z1211 Encounter for screening for malignant neoplasm of colon: Secondary | ICD-10-CM

## 2021-09-23 DIAGNOSIS — R221 Localized swelling, mass and lump, neck: Secondary | ICD-10-CM

## 2021-09-23 NOTE — Telephone Encounter (Signed)
Copied from CRM [id number]. Topic: General - Other >> Sep 23, 2021  1:34 PM Traci Sermon wrote: Reason for CRM: Pt called in stating the [place] referral he has, he states if it can go ahead and be sent, he does not need a PA, because he will pay out of pocket, please advise.

## 2021-09-23 NOTE — Patient Instructions (Signed)
We will call you with today's lab results and we will call you with the results of your ultrasound when they are available.  Your blood pressure is slightly elevated today, I do encourage you to check your blood pressure at home, keep a written log and have available for all office visits.  Roney Jaffe, PA-C Physician Assistant [facility] [email or web address]   How to Take Your Blood Pressure Blood pressure is a measurement of how strongly your blood is pressing against the walls of your arteries. Arteries are blood vessels that carry blood from your heart throughout your body. Your health care provider takes your blood pressure at each office visit. You can also take your own blood pressure at home with a blood pressure monitor. You may need to take your own blood pressure to: Confirm a diagnosis of high blood pressure (hypertension). Monitor your blood pressure over time. Make sure your blood pressure medicine is working. Supplies needed: Blood pressure monitor. A chair to sit in. This should be a chair where you can sit upright with your back supported. Do not sit on a soft couch or an armchair. Table or desk. Small notebook and pencil or pen. How to prepare To get the most accurate reading, avoid the following for 30 minutes before you check your blood pressure: Drinking caffeine. Drinking alcohol. Eating. Smoking. Exercising. Five minutes before you check your blood pressure: Use the bathroom and urinate so that you have an empty bladder. Sit quietly in a chair. Do not talk. How to take your blood pressure To check your blood pressure, follow the instructions in the manual that came with your blood pressure monitor. If you have a digital blood pressure monitor, the instructions may be as follows: Sit up straight in a chair. Place your feet on the floor. Do not cross your ankles or legs. Rest your left arm at the level  of your heart on a table or desk or on the arm of a chair. Pull up your shirt sleeve. Wrap the blood pressure cuff around the upper part of your left arm, 1 inch (2.5 cm) above your elbow. It is best to wrap the cuff around bare skin. Fit the cuff snugly, but not too tightly, around your arm. You should be able to place only one finger between the cuff and your arm. Position the cord so that it rests in the bend of your elbow. Press the power button. Sit quietly while the cuff inflates and deflates. Read the digital reading on the monitor screen and write the numbers down (record them) in a notebook. Wait 2-3 minutes, then repeat the steps, starting at step 1. What does my blood pressure reading mean? A blood pressure reading consists of a higher number over a lower number. Ideally, your blood pressure should be below 120/80. The first ("top") number is called the systolic pressure. It is a measure of the pressure in your arteries as your heart beats. The second ("bottom") number is called the diastolic pressure. It is a measure of the pressure in your arteries as the heart relaxes. Blood pressure is classified into four stages. The following are the stages for adults who do not have a short-term serious illness or a chronic condition. Systolic pressure and diastolic pressure are measured in a unit called mm Hg (millimeters of mercury).  Normal Systolic pressure: below 120. Diastolic pressure: below 80. Elevated Systolic pressure: 120-129. Diastolic pressure: below 80. Hypertension stage 1 Systolic pressure: 130-139. Diastolic pressure: 80-89.  Hypertension stage 2 Systolic pressure: 140 or above. Diastolic pressure: 90 or above. You can have elevated blood pressure or hypertension even if only the systolic or only the diastolic number in your reading is higher than normal. Follow these instructions at home: Medicines Take over-the-counter and prescription medicines only as told by your  health care provider. Tell your health care provider if you are having any side effects from blood pressure medicine. General instructions Check your blood pressure as often as recommended by your health care provider. Check your blood pressure at the same time every day. Take your monitor to the next appointment with your health care provider to make sure that: You are using it correctly. It provides accurate readings. Understand what your goal blood pressure numbers are. Keep all follow-up visits. This is important. General tips Your health care provider can suggest a reliable monitor that will meet your needs. There are several types of home blood pressure monitors. Choose a monitor that has an arm cuff. Do not choose a monitor that measures your blood pressure from your wrist or finger. Choose a cuff that wraps snugly, not too tight or too loose, around your upper arm. You should be able to fit only one finger between your arm and the cuff. You can buy a blood pressure monitor at most drugstores or online. Where to find more information American Heart Association: www.heart.org Contact a health care provider if: Your blood pressure is consistently high. Your blood pressure is suddenly low. Get help right away if: Your systolic blood pressure is higher than 180. Your diastolic blood pressure is higher than 120. These symptoms may be an emergency. Get help right away. Call 911. Do not wait to see if the symptoms will go away. Do not drive yourself to the hospital. Summary Blood pressure is a measurement of how strongly your blood is pressing against the walls of your arteries. A blood pressure reading consists of a higher number over a lower number. Ideally, your blood pressure should be below 120/80. Check your blood pressure at the same time every day. Avoid caffeine, alcohol, smoking, and exercise for 30 minutes prior to checking your blood pressure. These agents can affect the  accuracy of the blood pressure reading. This information is not intended to replace advice given to you by your health care provider. Make sure you discuss any questions you have with your health care provider. Document Revised: 12/24/2020 Document Reviewed: 12/24/2020 Elsevier Patient Education  2023 [employer].

## 2021-09-23 NOTE — Progress Notes (Signed)
Established Patient Office Visit  Subjective   Patient ID: Joel Kelley., male    DOB: 1965/10/25  Age: 56 y.o. MRN: [id number]  Chief Complaint  Patient presents with   Mass    States that he started having some swelling and tenderness on the left side of his neck near the back approximately 6 weeks ago.  States that the pain has resolved, but states he still notices a swelling.  States that he has tried to schedule colonoscopy but is frustrated with the gastroenterology office and difficulty with communication, states that he wants to just proceed with Cologuard at this time.  Reports that he does check his blood pressure at home, states it is generally 128/80.  States that he does go to urology at least once a year due to family history of prostate cancer.  States last PSA was within normal limits.   Past Medical History:  Diagnosis Date   Anxiety    Arthritis    GERD (gastroesophageal reflux disease)    Social History   Socioeconomic History   Marital status: Married    Spouse name: Not on file   Number of children: Not on file   Years of education: Not on file   Highest education level: Not on file  Occupational History   Occupation: Research scientist (medical)  Tobacco Use   Smoking status: Never   Smokeless tobacco: Never  Vaping Use   Vaping Use: Never used  Substance and Sexual Activity   Alcohol use: Yes    Alcohol/week: 3.0 standard drinks    Types: 3 Glasses of wine per week    Comment: social   Drug use: No   Sexual activity: Yes    Birth control/protection: None    Comment: number of sex partners in the last 12 months  1  Other Topics Concern   Not on file  Social History Narrative   Not on file   Social Determinants of Health   Financial Resource Strain: Not on file  Food Insecurity: Not on file  Transportation Needs: Not on file  Physical Activity: Not on file  Stress: Not on file  Social Connections: Not on file  Intimate Partner Violence: Not on  file   Family History  Problem Relation Age of Onset   Cerebral aneurysm Mother    Healthy Father    Alzheimer's disease Maternal Grandmother    No Known Allergies    Review of Systems  Constitutional: Negative.   HENT: Negative.    Eyes: Negative.   Respiratory:  Negative for shortness of breath.   Cardiovascular:  Negative for chest pain.  Gastrointestinal: Negative.   Genitourinary: Negative.   Musculoskeletal: Negative.   Skin: Negative.   Neurological: Negative.   Endo/Heme/Allergies: Negative.   Psychiatric/Behavioral: Negative.       Objective:     BP 134/84 (BP Location: Right Arm, Patient Position: Sitting, Cuff Size: Normal)   Pulse 68   Temp 98 F (36.7 C) (Oral)   Resp 18   Ht 5\' 10"  (1.778 m)   Wt 208 lb (94.3 kg)   SpO2 97%   BMI 29.84 kg/m  BP Readings from Last 3 Encounters:  09/23/21 134/84  08/01/21 (!) 173/106  10/28/20 126/84      Physical Exam Vitals and nursing note reviewed.  Constitutional:      Appearance: Normal appearance.  HENT:     Head: Normocephalic and atraumatic.     Right Ear: External ear normal.  Left Ear: External ear normal.     Nose: Nose normal.     Mouth/Throat:     Mouth: Mucous membranes are moist.     Pharynx: Oropharynx is clear.  Eyes:     Extraocular Movements: Extraocular movements intact.     Conjunctiva/sclera: Conjunctivae normal.     Pupils: Pupils are equal, round, and reactive to light.  Neck:     Comments: Mobile non-tender node/mass Cardiovascular:     Rate and Rhythm: Normal rate and regular rhythm.     Pulses: Normal pulses.     Heart sounds: Normal heart sounds.  Pulmonary:     Effort: Pulmonary effort is normal.  Musculoskeletal:        General: Normal range of motion.  Lymphadenopathy:     Cervical: Cervical adenopathy present.     Left cervical: Posterior cervical adenopathy present.  Skin:    General: Skin is warm and dry.  Neurological:     General: No focal deficit  present.     Mental Status: He is alert and oriented to person, place, and time.  Psychiatric:        Mood and Affect: Mood normal.        Behavior: Behavior normal.        Thought Content: Thought content normal.        Judgment: Judgment normal.        Assessment & Plan:   Problem List Items Addressed This Visit       Immune and Lymphatic   Enlarged lymph nodes - Primary   Other Visit Diagnoses     Colon cancer screening       Relevant Orders   Cologuard   Screening, lipid       Relevant Orders   Lipid panel   Screening PSA (prostate specific antigen)          1. Mass of left side of neck Patient education given on supportive care, red flags given for prompt reevaluation patient - CBC with Differential/Platelet - US Soft Tissue Head/Neck (NON-THYROID); Future - Comp. Metabolic Panel (12)   2. Colon cancer screening Order for Cologuard started. - Cologuard  3. Screening, lipid Fasting labs completed today - Lipid panel   I have reviewed the patient's medical history (PMH, PSH, Social History, Family History, Medications, and allergies) , and have been updated if relevant. I spent 30 minutes reviewing chart and  face to face time with patient.    Return if symptoms worsen or fail to improve.    Kasandra Knudsen Mayers, PA-C

## 2021-09-23 NOTE — Progress Notes (Signed)
Patient has not eaten or taken medicaton today. Patient denies pain at this time, Patient noticed a swollen lymph node on the L side of his neck a month and a half ago with tenderness. Pain has subsided and swelling is still present.

## 2021-09-24 LAB — LIPID PANEL
Chol/HDL Ratio: 3.8 ratio (ref 0.0–5.0)
Cholesterol, Total: 187 mg/dL (ref 100–199)
HDL: 49 mg/dL (ref >39)
LDL Chol Calc (NIH): 120 mg/dL — ABNORMAL HIGH (ref 0–99)
Triglycerides: 99 mg/dL (ref 0–149)
VLDL Cholesterol Cal: 18 mg/dL (ref 5–40)

## 2021-09-24 LAB — COMP. METABOLIC PANEL (12)
AST: 16 IU/L (ref 0–40)
Albumin/Globulin Ratio: 1.9 (ref 1.2–2.2)
Albumin: 4.8 g/dL (ref 3.8–4.9)
Alkaline Phosphatase: 49 IU/L (ref 44–121)
BUN/Creatinine Ratio: 9 (ref 9–20)
BUN: 10 mg/dL (ref 6–24)
Bilirubin Total: 0.3 mg/dL (ref 0.0–1.2)
Calcium: 10 mg/dL (ref 8.7–10.2)
Chloride: 104 mmol/L (ref 96–106)
Creatinine, Ser: 1.13 mg/dL (ref 0.76–1.27)
Globulin, Total: 2.5 g/dL (ref 1.5–4.5)
Glucose: 101 mg/dL — ABNORMAL HIGH (ref 70–99)
Potassium: 4.4 mmol/L (ref 3.5–5.2)
Sodium: 142 mmol/L (ref 134–144)
Total Protein: 7.3 g/dL (ref 6.0–8.5)
eGFR: 78 mL/min/{1.73_m2} (ref >59)

## 2021-09-24 LAB — CBC WITH DIFFERENTIAL/PLATELET
Basophils Absolute: 0.1 10*3/uL (ref 0.0–0.2)
Basos: 2 %
EOS (ABSOLUTE): 0.2 10*3/uL (ref 0.0–0.4)
Eos: 4 %
Hematocrit: 43.3 % (ref 37.5–51.0)
Hemoglobin: 14.2 g/dL (ref 13.0–17.7)
Immature Grans (Abs): 0.1 10*3/uL (ref 0.0–0.1)
Immature Granulocytes: 1 %
Lymphocytes Absolute: 1.7 10*3/uL (ref 0.7–3.1)
Lymphs: 28 %
MCH: 31.3 pg (ref 26.6–33.0)
MCHC: 32.8 g/dL (ref 31.5–35.7)
MCV: 96 fL (ref 79–97)
Monocytes Absolute: 0.6 10*3/uL (ref 0.1–0.9)
Monocytes: 10 %
Neutrophils Absolute: 3.5 10*3/uL (ref 1.4–7.0)
Neutrophils: 55 %
Platelets: 283 10*3/uL (ref 150–450)
RBC: 4.53 x10E6/uL (ref 4.14–5.80)
RDW: 11.7 % (ref 11.6–15.4)
WBC: 6.1 10*3/uL (ref 3.4–10.8)

## 2021-09-27 ENCOUNTER — Ambulatory Visit
Admission: RE | Admit: 2021-09-27 | Discharge: 2021-09-27 | Disposition: A | Discharge status: Home / Self Care | Payer: Self-pay | Source: Ambulatory Visit | Attending: Physician Assistant | Admitting: Physician Assistant

## 2021-09-27 DIAGNOSIS — R221 Localized swelling, mass and lump, neck: Secondary | ICD-10-CM

## 2021-09-28 ENCOUNTER — Telehealth: Payer: Self-pay | Admitting: Family

## 2021-09-28 NOTE — Telephone Encounter (Signed)
Patient called in asking for results of his ultrasound. Please assist patient further.

## 2021-09-28 NOTE — Addendum Note (Signed)
Addended by: Roney Jaffe on: 09/28/2021 05:42 PM   Modules accepted: Orders

## 2021-09-29 ENCOUNTER — Telehealth: Payer: Self-pay | Admitting: Family

## 2021-09-29 ENCOUNTER — Encounter: Payer: Self-pay | Admitting: Physician Assistant

## 2021-09-29 NOTE — Telephone Encounter (Signed)
Patient called in to see if results were available from his [place]. Advised patient that someone would be reaching out to him to go over results.

## 2021-09-29 NOTE — Telephone Encounter (Signed)
Copied from CRM [id number]. Topic: General - Other >> Sep 29, 2021  1:07 PM Gaetana Michaelis A wrote: Reason for CRM: Patient would like to be contacted by a member of clinical staff to review their labs from 09/23/21  Please contact further when available

## 2021-09-30 ENCOUNTER — Telehealth: Payer: Self-pay

## 2021-09-30 ENCOUNTER — Telehealth: Payer: Self-pay | Admitting: Physician Assistant

## 2021-09-30 ENCOUNTER — Other Ambulatory Visit: Payer: Self-pay | Admitting: Physician Assistant

## 2021-09-30 ENCOUNTER — Other Ambulatory Visit (HOSPITAL_COMMUNITY): Payer: Self-pay | Admitting: Physician Assistant

## 2021-09-30 NOTE — Telephone Encounter (Signed)
-----   Message from Roney Jaffe, [place] sent at 09/27/2021  2:45 PM EDT ----- Please call patient and let him know that his kidney function and liver function are within normal limits.  He does not show signs of anemia.  His cholesterol overall is within normal limits, however his LDL is still above normal limits.  His risk of a cardiovascular event in the next 10 years is 6.5%.  I strongly encouraged him to follow a low-cholesterol diet, continue daily exercise, and have his cholesterol rechecked in 6 months to a year.  The 10-year ASCVD risk score (Arnett DK, et al., 2019) is: 6.5%   Values used to calculate the score:     Age: 56 years     Sex: Male     Is Non-Hispanic African American: Yes     Diabetic: No     Tobacco smoker: No     Systolic Blood Pressure: 134 mmHg     Is BP treated: No     HDL Cholesterol: 49 mg/dL     Total Cholesterol: 187 mg/dL

## 2021-09-30 NOTE — Telephone Encounter (Signed)
Copied from CRM [id number]. Topic: General - Other >> Sep 30, 2021  1:32 PM Monica P wrote: Reason for CRM: Pt would like his CT order to be sent to [facility] fax [phone].

## 2021-09-30 NOTE — Telephone Encounter (Deleted)
Patient has called for an update on the status of their previously requested referral   Please contact further when possible

## 2021-10-01 NOTE — Telephone Encounter (Signed)
Pt is requesting an update pt wants to know if the fax has been sent. If yes, pt requesting it be resent as they have not received it.  Pt is requesting a call back as soon as possible.

## 2021-10-04 ENCOUNTER — Telehealth: Payer: Self-pay | Admitting: Physician Assistant

## 2021-10-04 NOTE — Telephone Encounter (Signed)
Joel [name], this is a copy/paste of a message from 09-30-21 that I sent to [facility]. *Pt would like his CT order to be sent to [facility] fax [phone].   When this referral/order was first placed it went to [facility], which he is scheduled for on 10-11-21, but the patient is requesting [facility] instead. Can you please place a new referral as External and notate [facility], so that the referral coordinator will know who to send it to?

## 2021-10-05 ENCOUNTER — Other Ambulatory Visit: Payer: Self-pay | Admitting: Physician Assistant

## 2021-10-05 DIAGNOSIS — R599 Enlarged lymph nodes, unspecified: Secondary | ICD-10-CM

## 2021-10-05 NOTE — Telephone Encounter (Signed)
I have left a message for the patient to call and cancel the appointment. I will keep a check on it.

## 2021-10-06 ENCOUNTER — Other Ambulatory Visit: Payer: Self-pay | Admitting: Physician Assistant

## 2021-10-06 DIAGNOSIS — R599 Enlarged lymph nodes, unspecified: Secondary | ICD-10-CM

## 2021-10-06 NOTE — Telephone Encounter (Signed)
Patient returning call, informed patient of the note below, transferred pt to practice to cancel ct appt  Patient is requesting a call back to discuss next steps after current ct appt is canceled

## 2021-10-06 NOTE — Progress Notes (Signed)
Change in location per patient

## 2021-10-08 LAB — COLOGUARD

## 2021-10-11 ENCOUNTER — Telehealth: Payer: Self-pay

## 2021-10-11 ENCOUNTER — Ambulatory Visit (HOSPITAL_COMMUNITY): Payer: BLUE CROSS/BLUE SHIELD

## 2021-10-12 NOTE — Progress Notes (Signed)
error 

## 2021-10-12 NOTE — Telephone Encounter (Signed)
Unable to reach. Mailbox for both numbers listed are full. Unable to leave message.

## 2021-10-13 ENCOUNTER — Encounter: Payer: Self-pay | Admitting: *Deleted

## 2021-10-13 ENCOUNTER — Encounter: Payer: Self-pay | Admitting: Family

## 2021-10-13 NOTE — Telephone Encounter (Signed)
Unable to reach. Mailbox is full and cannot  accept any message at this time.   Will mail letter.

## 2021-10-14 ENCOUNTER — Telehealth: Payer: Self-pay

## 2021-10-14 NOTE — Telephone Encounter (Signed)
Pt called, he was wanting results from CT that was done on 10/07/21 with [facility]. I explained to pt that since it was done thru [facility] results were unavailable but can see them in Care Everywhere. Pt said that he thinks he spoke with [name], NP the other day briefly about them but is wanting to know where we are at with getting MRI or CAT scan scheduled. I advised him that the referral was placed on 10/06/21 and looks like someone should be contacting him for scheduling that appt. He prefers to stay with [facility] for imaging if possible and was unsure if referral needed to be changed for there or not. Attempted to walk pt thru on Mychart linking account so he can see information on [facility] account as well. Pt wants someone to fu with him on this if possible because he wants to go ahead and do imaging rather than wait 6 months.

## 2021-10-15 ENCOUNTER — Telehealth: Payer: Self-pay | Admitting: Physician Assistant

## 2021-10-22 NOTE — Telephone Encounter (Signed)
Left message on voicemail to return call.

## 2021-11-09 ENCOUNTER — Telehealth: Payer: Self-pay | Admitting: Physician Assistant

## 2022-10-26 IMAGING — US US EXTREM LOW*R* LIMITED
1 series · 11 of 11 positions shown · non-contrast
Comparison: None.

CLINICAL DATA: Right thigh painful lump.

EXAM:
ULTRASOUND right LOWER EXTREMITY LIMITED
TECHNIQUE: Ultrasound examination of the lower extremity soft tissues was
performed in the area of clinical concern.

[Series 1: us extrem low*right* limited · 11 of 11 slices shown]
[im 1/11]
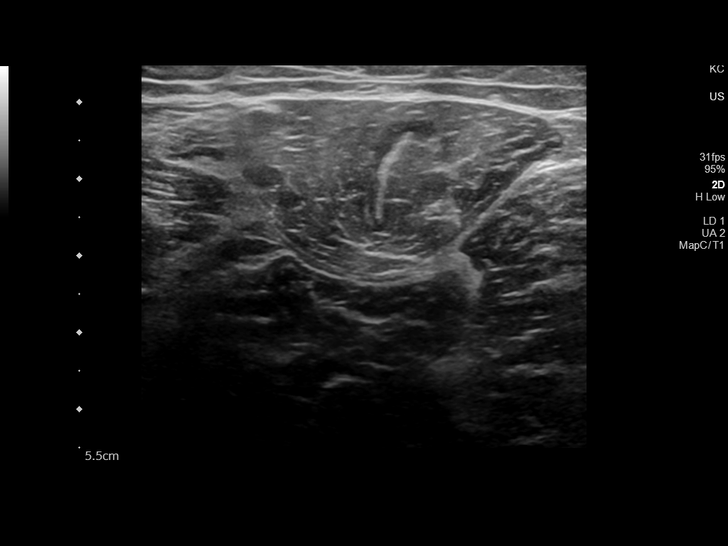
[im 2/11]
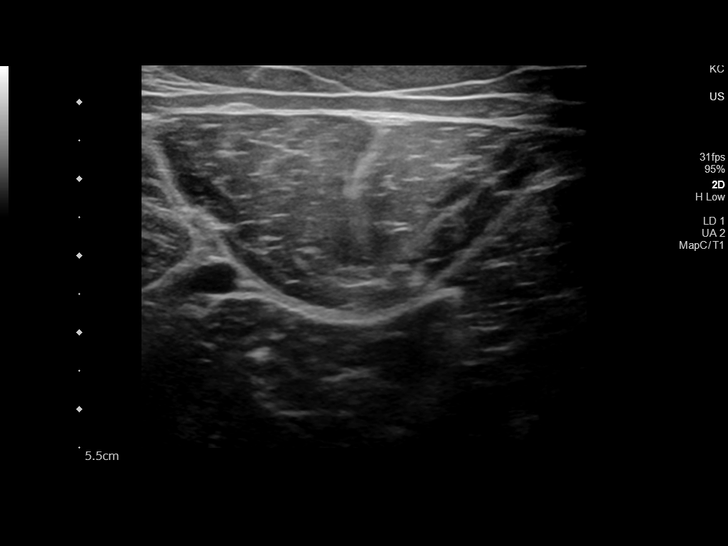
[im 3/11]
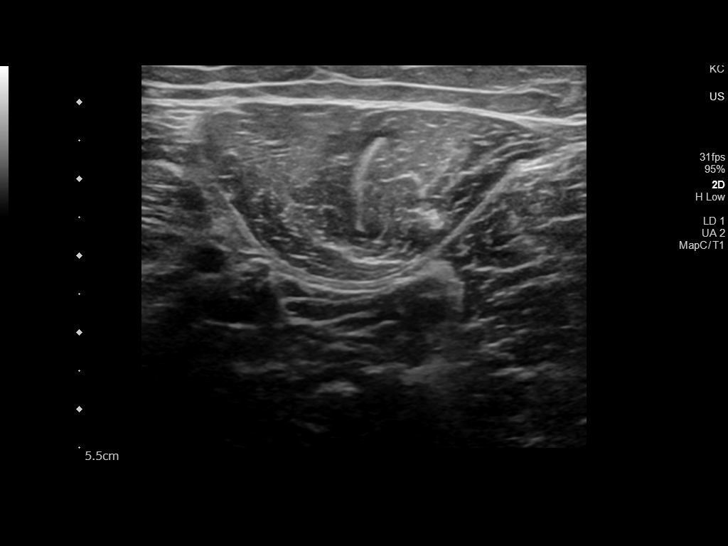
[im 4/11]
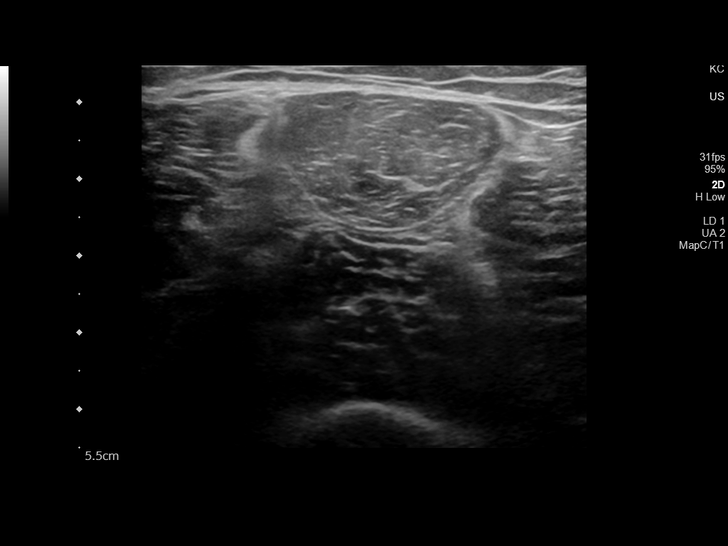
[im 5/11]
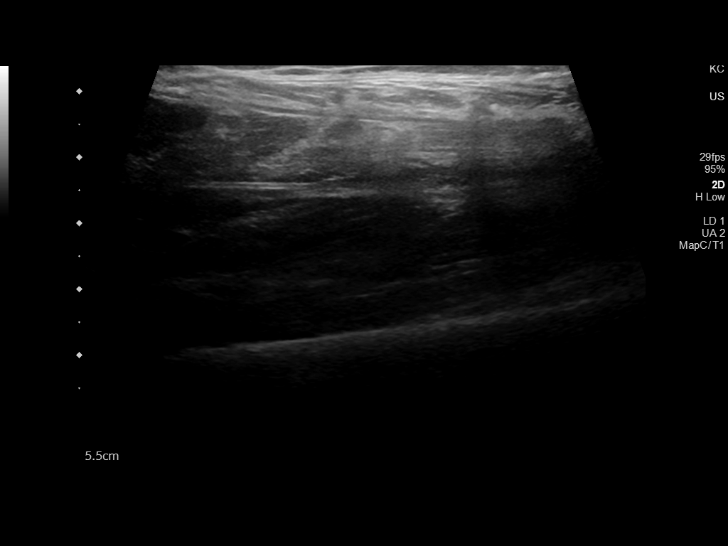
[im 6/11]
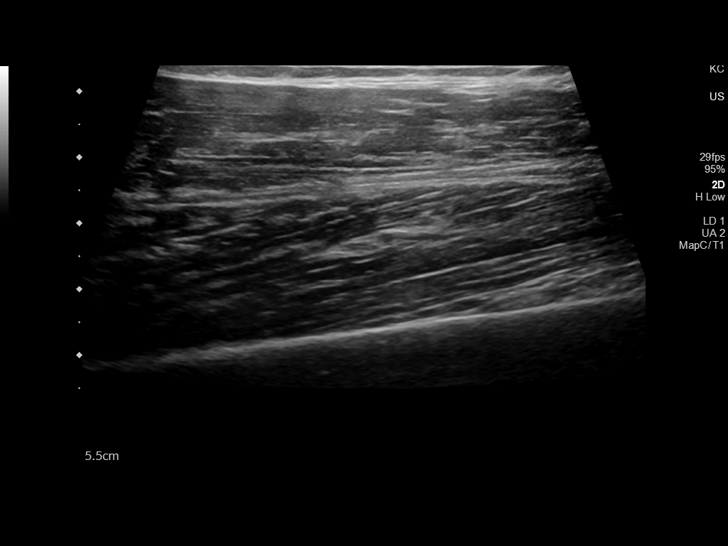
[im 7/11]
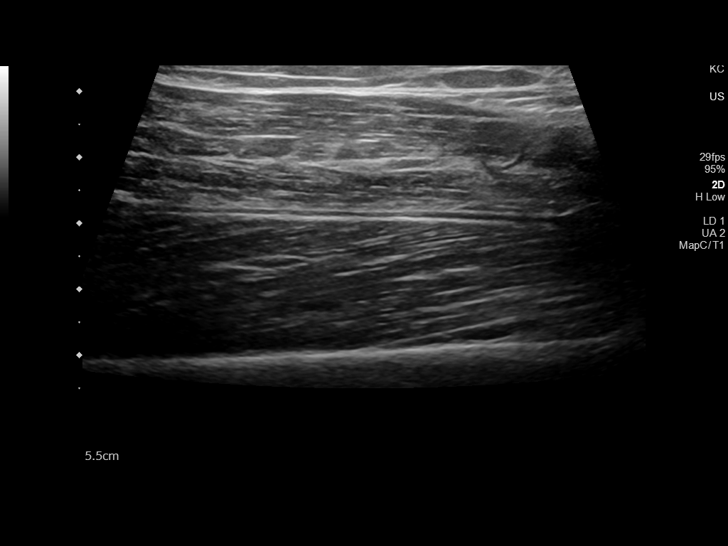
[im 8/11]
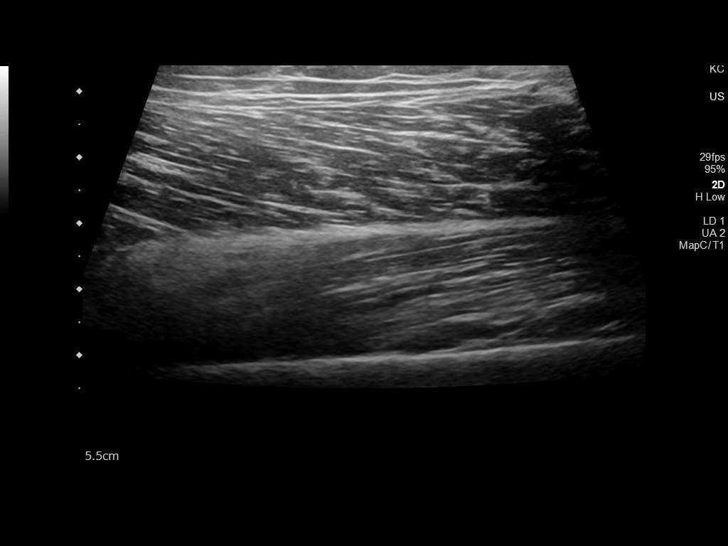
[im 9/11]
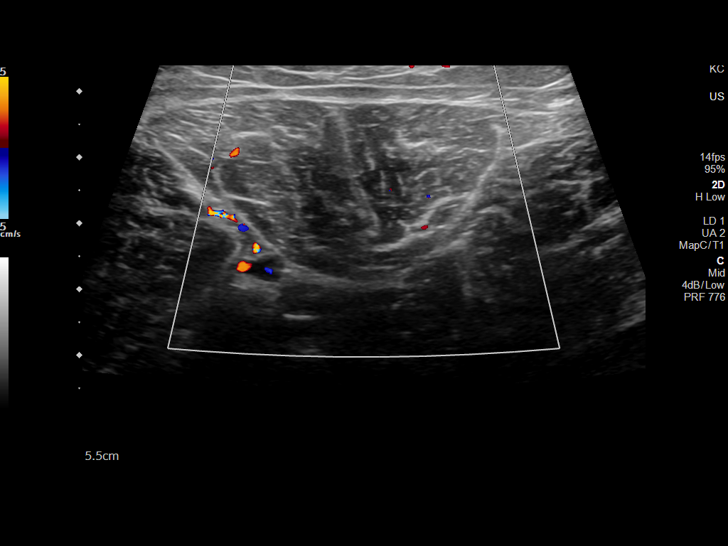
[im 10/11]
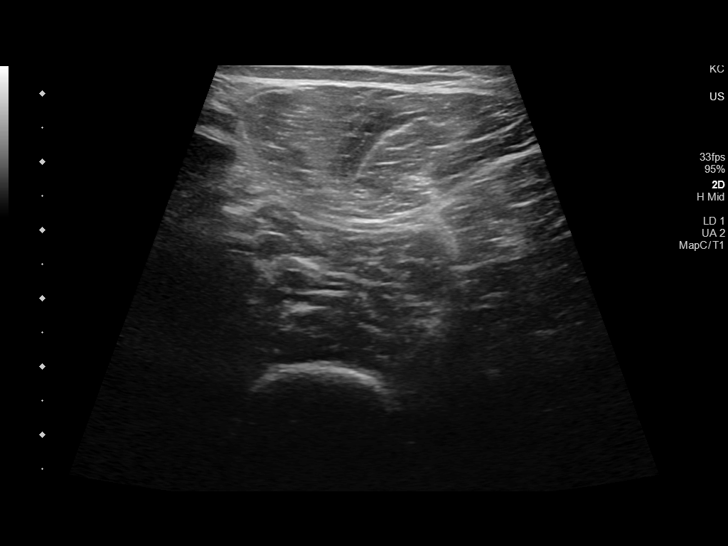
[im 11/11]
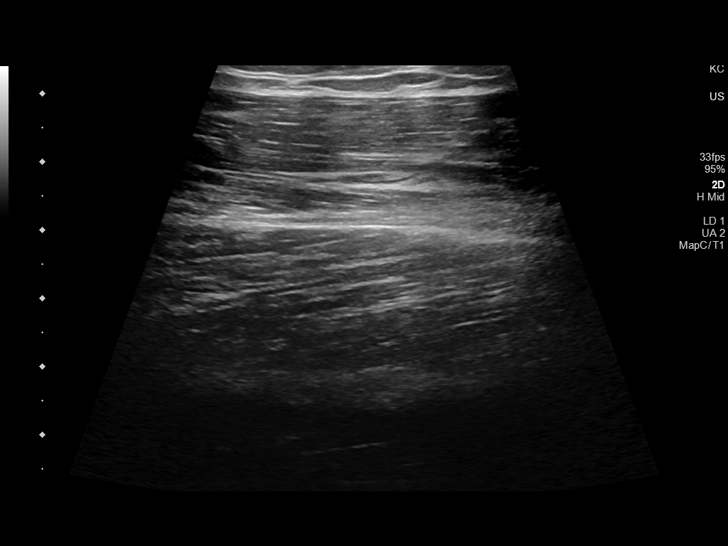

[11 of 11 positions shown; findings below may reference images not displayed]

FINDINGS: Sonographic images of the soft tissues of the right lower extremity
in the region of the clinical concern was performed using grayscale
and color Doppler.

No mass, fluid collection, or architectural distortion. No abnormal
vascularity.
IMPRESSION: Negative.

## 2023-06-01 ENCOUNTER — Emergency Department (HOSPITAL_COMMUNITY)
Admission: EM | Admit: 2023-06-01 | Discharge: 2023-06-02 | Discharge status: Against Medical Advice | Payer: Self-pay | Source: Home / Self Care

## 2023-08-22 IMAGING — US US SOFT TISSUE HEAD/NECK
1 series · 14 of 25 positions shown · non-contrast
Comparison: None Available.

CLINICAL DATA: Mass of the left side of the neck noted over the
last 6 weeks.

EXAM:
ULTRASOUND OF HEAD/NECK SOFT TISSUES
TECHNIQUE: Ultrasound examination of the head and neck soft tissues was
performed in the area of clinical concern.

[Series 1: us soft tissue head/neck · 0.05mm/px · 14 of 25 slices shown]
[im 1/25]
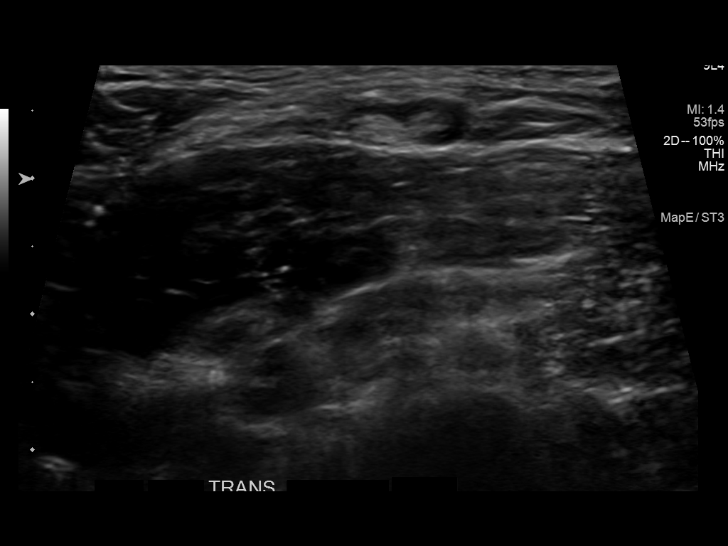
[im 3/25]
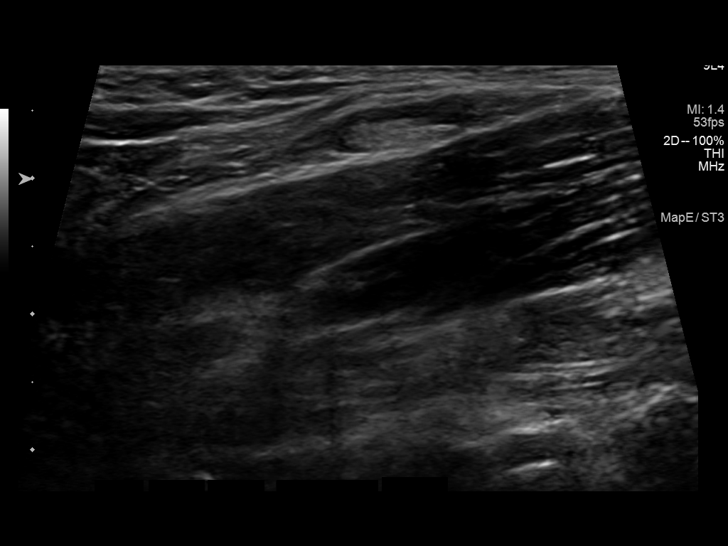
[im 5/25]
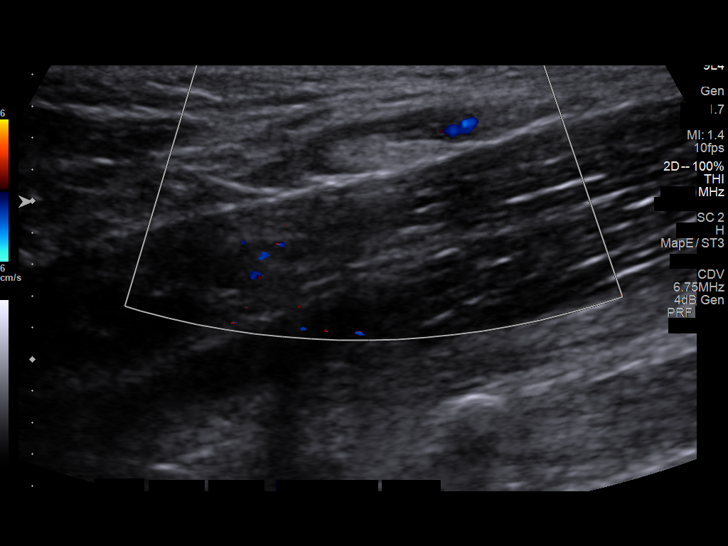
[im 7/25]
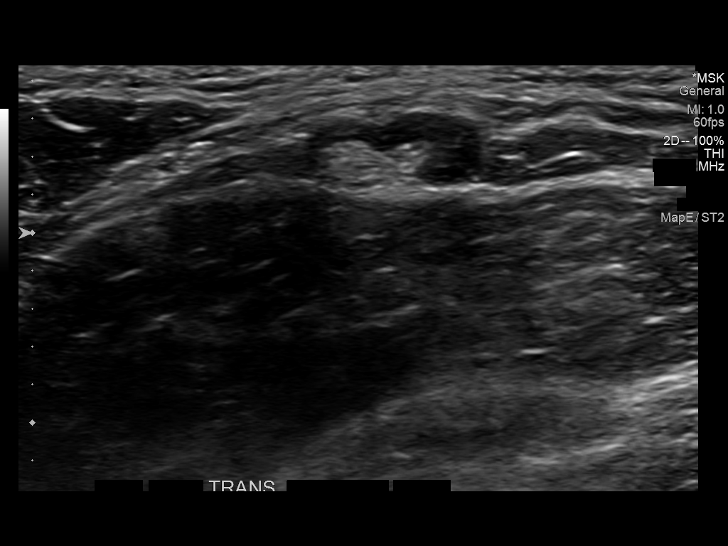
[im 9/25]
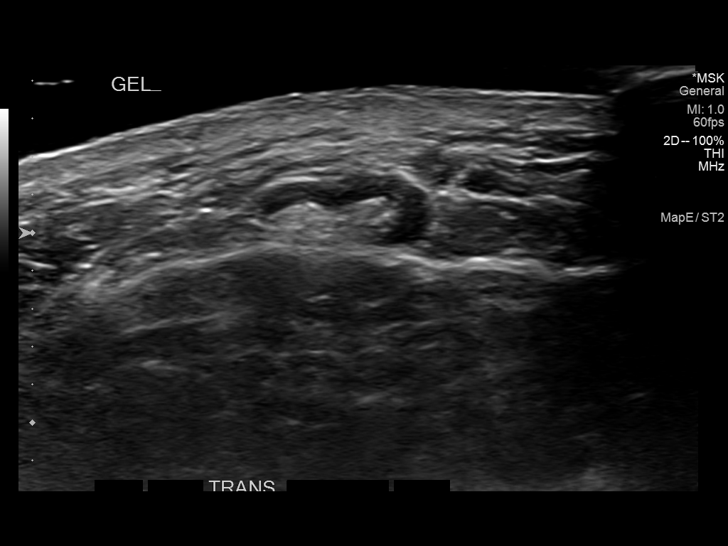
[im 10/25]
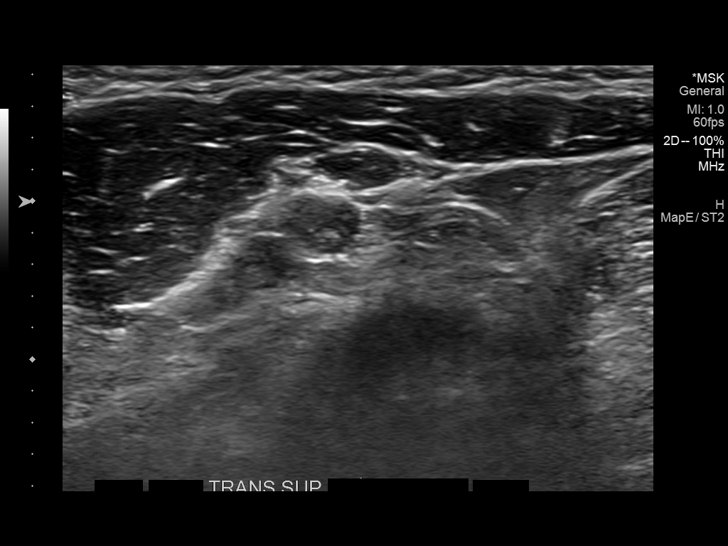
[im 12/25]
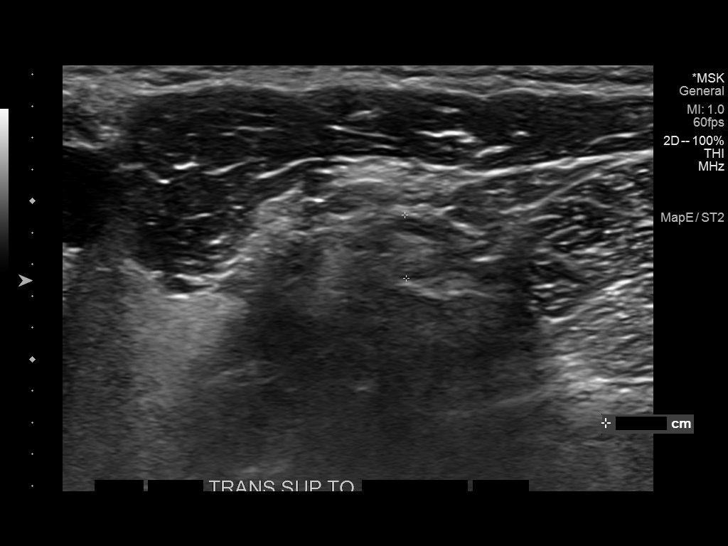
[im 14/25]
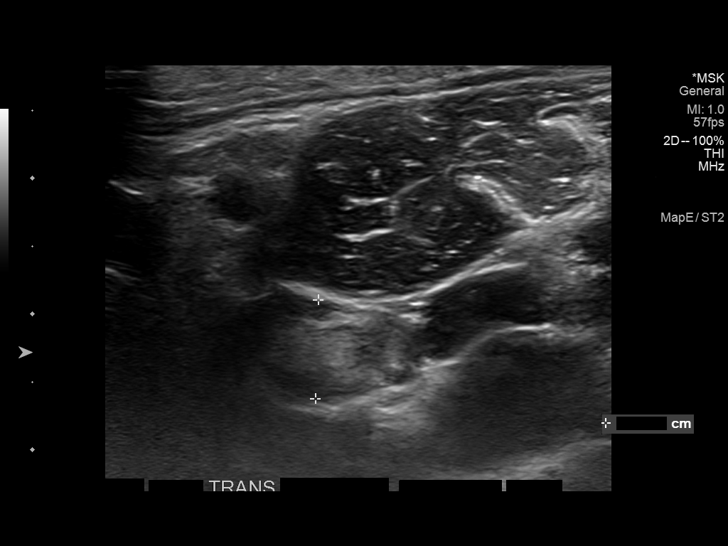
[im 16/25]
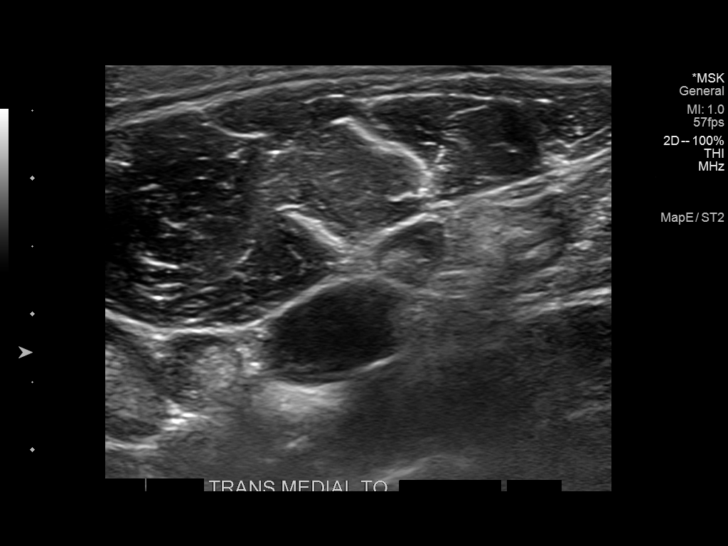
[im 17/25]
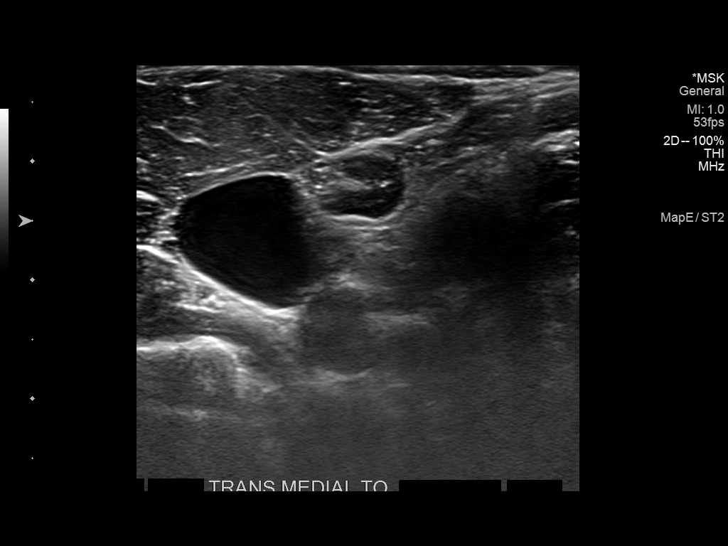
[im 19/25]
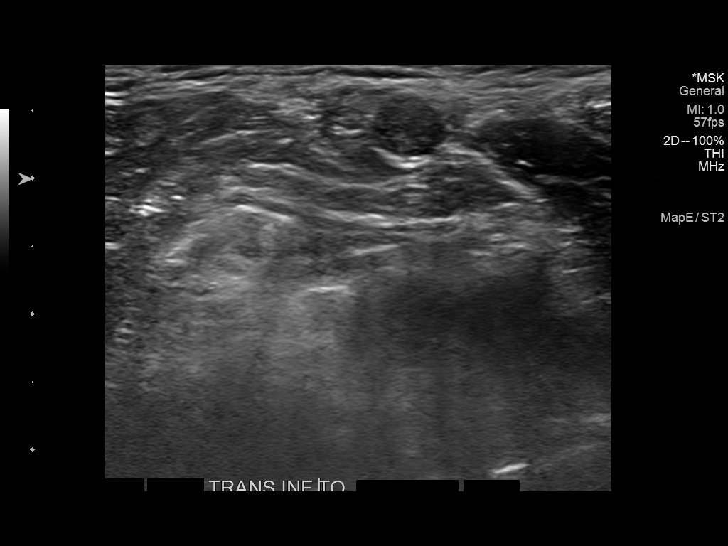
[im 21/25]
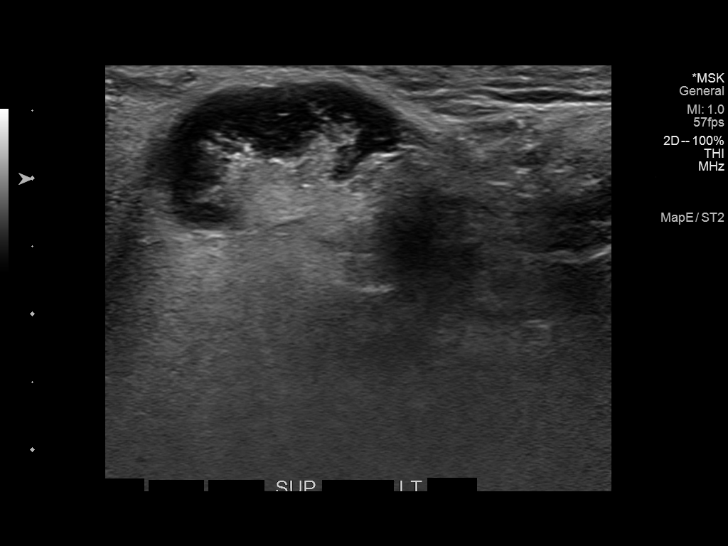
[im 23/25]
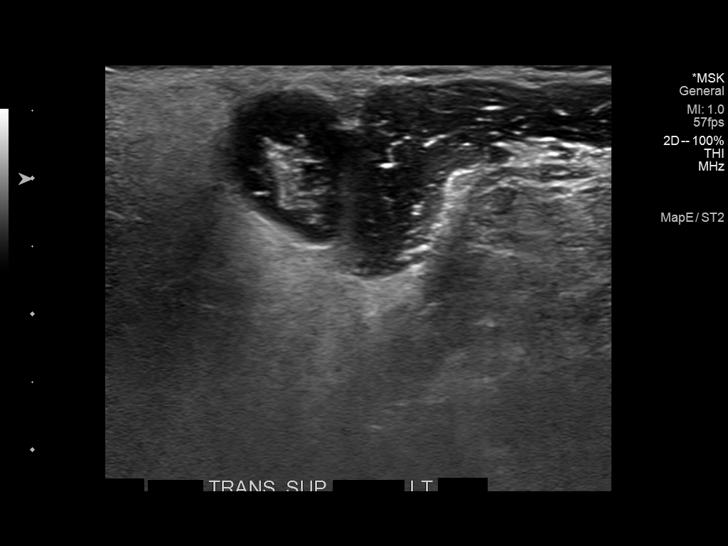
[im 25/25]
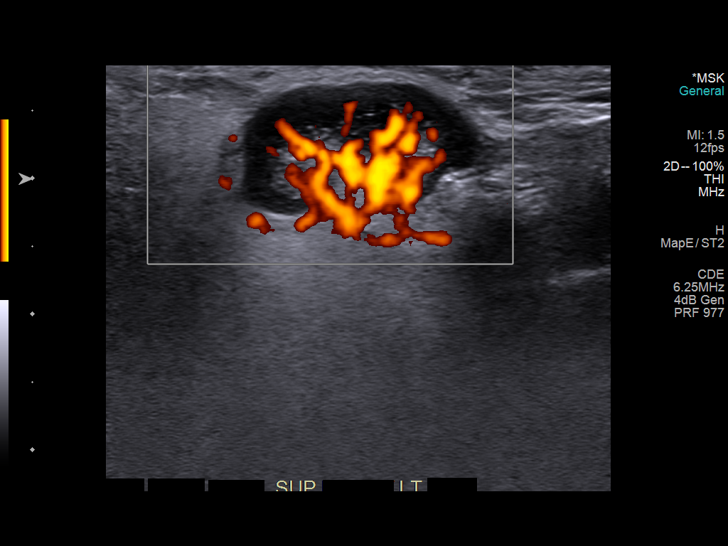

[14 of 25 positions shown; findings below may reference images not displayed]

FINDINGS: At the location of the palpable concern, there is a 3 x 5 x 9 mm
lymph node with a normal appearing fatty hilum. Elsewhere, there are
multiple cervical chain lymph nodes, this within the range of normal
in short axis dimension though at the upper limits there of. There
does appear to be a single lymph node with short axis dimension of 6
mm which shows abnormal architecture. Because of these findings, CT
of the neck with contrast would be suggested.
IMPRESSION: The palpable abnormality appears to correlate with a normal
appearing normal size lymph node. However, other cervical chain
nodes on the left are at the upper limits of normal in size and one
appears to show abnormal morphology, though its short axis diameter
is only 6 mm. Because of the inconclusive nature of these findings,
CT scan of the neck with contrast would be suggested.
